# Patient Record
Sex: Female | Born: 1955
Health system: Southern US, Community
[De-identification: ages and names within clinical notes are randomized; demographics above are authoritative.]

## PROBLEM LIST (undated history)

## (undated) DIAGNOSIS — I10 Essential (primary) hypertension: Secondary | ICD-10-CM

## (undated) DIAGNOSIS — C50919 Malignant neoplasm of unspecified site of unspecified female breast: Secondary | ICD-10-CM

## (undated) HISTORY — PX: OTHER SURGICAL HISTORY: SHX169

## (undated) HISTORY — DX: Malignant neoplasm of unspecified site of unspecified female breast: C50.919

## (undated) HISTORY — PX: REDUCTION MAMMAPLASTY: SUR839

## (undated) HISTORY — DX: Essential (primary) hypertension: I10

## (undated) HISTORY — PX: BREAST LUMPECTOMY: SHX2

---

## 1998-06-16 ENCOUNTER — Other Ambulatory Visit: Admission: RE | Admit: 1998-06-16 | Discharge: 1998-06-16 | Payer: Self-pay | Admitting: Obstetrics & Gynecology

## 1999-08-14 ENCOUNTER — Other Ambulatory Visit: Admission: RE | Admit: 1999-08-14 | Discharge: 1999-08-14 | Payer: Self-pay | Admitting: Obstetrics & Gynecology

## 1999-08-15 ENCOUNTER — Other Ambulatory Visit: Admission: RE | Admit: 1999-08-15 | Discharge: 1999-08-15 | Payer: Self-pay | Admitting: Obstetrics & Gynecology

## 1999-08-21 ENCOUNTER — Ambulatory Visit (HOSPITAL_COMMUNITY): Admission: RE | Admit: 1999-08-21 | Discharge: 1999-08-21 | Payer: Self-pay | Admitting: Obstetrics & Gynecology

## 2000-10-13 ENCOUNTER — Other Ambulatory Visit: Admission: RE | Admit: 2000-10-13 | Discharge: 2000-10-13 | Payer: Self-pay | Admitting: Obstetrics & Gynecology

## 2017-03-11 DIAGNOSIS — C50511 Malignant neoplasm of lower-outer quadrant of right female breast: Secondary | ICD-10-CM | POA: Diagnosis not present

## 2017-03-11 DIAGNOSIS — Z170 Estrogen receptor positive status [ER+]: Secondary | ICD-10-CM | POA: Diagnosis not present

## 2017-06-09 DIAGNOSIS — R7309 Other abnormal glucose: Secondary | ICD-10-CM | POA: Diagnosis not present

## 2017-06-09 DIAGNOSIS — E789 Disorder of lipoprotein metabolism, unspecified: Secondary | ICD-10-CM | POA: Diagnosis not present

## 2017-08-07 DIAGNOSIS — I10 Essential (primary) hypertension: Secondary | ICD-10-CM | POA: Diagnosis not present

## 2017-11-18 DIAGNOSIS — I10 Essential (primary) hypertension: Secondary | ICD-10-CM | POA: Diagnosis not present

## 2017-11-18 DIAGNOSIS — E7800 Pure hypercholesterolemia, unspecified: Secondary | ICD-10-CM | POA: Diagnosis not present

## 2017-11-21 ENCOUNTER — Other Ambulatory Visit: Payer: Self-pay | Admitting: Internal Medicine

## 2017-11-21 DIAGNOSIS — Z853 Personal history of malignant neoplasm of breast: Secondary | ICD-10-CM

## 2017-12-03 ENCOUNTER — Telehealth: Payer: Self-pay | Admitting: Hematology

## 2017-12-03 ENCOUNTER — Encounter: Payer: Self-pay | Admitting: Hematology

## 2017-12-03 NOTE — Telephone Encounter (Signed)
Pt has been scheduled to see Dr. Burr Medico on 10/24 at 230pm. Pt aware to arrive 30 minutes early. Letter mailed to the pt.

## 2017-12-16 ENCOUNTER — Other Ambulatory Visit: Payer: Self-pay | Admitting: Internal Medicine

## 2017-12-16 DIAGNOSIS — Z9889 Other specified postprocedural states: Secondary | ICD-10-CM

## 2017-12-18 ENCOUNTER — Other Ambulatory Visit: Payer: Self-pay | Admitting: Internal Medicine

## 2017-12-18 ENCOUNTER — Other Ambulatory Visit: Payer: Self-pay

## 2017-12-18 DIAGNOSIS — Z9889 Other specified postprocedural states: Secondary | ICD-10-CM

## 2017-12-19 ENCOUNTER — Ambulatory Visit
Admission: RE | Admit: 2017-12-19 | Discharge: 2017-12-19 | Disposition: A | Discharge status: Home / Self Care | Payer: 59 | Source: Ambulatory Visit | Attending: Internal Medicine | Admitting: Internal Medicine

## 2017-12-19 DIAGNOSIS — Z853 Personal history of malignant neoplasm of breast: Secondary | ICD-10-CM | POA: Diagnosis not present

## 2017-12-19 DIAGNOSIS — Z9889 Other specified postprocedural states: Secondary | ICD-10-CM

## 2017-12-19 DIAGNOSIS — R922 Inconclusive mammogram: Secondary | ICD-10-CM | POA: Diagnosis not present

## 2017-12-25 ENCOUNTER — Inpatient Hospital Stay: Payer: 59 | Admitting: Hematology

## 2018-02-16 DIAGNOSIS — I10 Essential (primary) hypertension: Secondary | ICD-10-CM | POA: Diagnosis not present

## 2018-02-16 DIAGNOSIS — Z853 Personal history of malignant neoplasm of breast: Secondary | ICD-10-CM | POA: Diagnosis not present

## 2018-02-16 DIAGNOSIS — E7800 Pure hypercholesterolemia, unspecified: Secondary | ICD-10-CM | POA: Diagnosis not present

## 2018-03-11 ENCOUNTER — Encounter: Payer: Self-pay | Admitting: Hematology

## 2018-03-11 ENCOUNTER — Telehealth: Payer: Self-pay | Admitting: Hematology

## 2018-03-11 NOTE — Telephone Encounter (Signed)
Pt cld to reschedule her appt w/Dr. Burr Medico from Oct. New appt has been scheduled for the pt to see Dr. Burr Medico on  3/5 at 230pm.

## 2018-03-30 ENCOUNTER — Ambulatory Visit
Admission: RE | Admit: 2018-03-30 | Discharge: 2018-03-30 | Disposition: A | Discharge status: Home / Self Care | Payer: 59 | Source: Ambulatory Visit | Attending: Internal Medicine | Admitting: Internal Medicine

## 2018-03-30 ENCOUNTER — Other Ambulatory Visit: Payer: Self-pay | Admitting: Internal Medicine

## 2018-03-30 DIAGNOSIS — M79642 Pain in left hand: Secondary | ICD-10-CM

## 2018-03-30 DIAGNOSIS — M1812 Unilateral primary osteoarthritis of first carpometacarpal joint, left hand: Secondary | ICD-10-CM | POA: Diagnosis not present

## 2018-04-10 DIAGNOSIS — M13842 Other specified arthritis, left hand: Secondary | ICD-10-CM | POA: Diagnosis not present

## 2018-05-06 NOTE — Progress Notes (Signed)
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 [facility] Staging form: Breast, AJCC 8th Edition - Pathologic stage from 04/03/2015: Stage IA (pT1b, pN0, cM0, G1, ER+, PR+, HER2-) - Signed by Truitt Merle, MD on 05/07/2018       Ductal carcinoma in situ (DCIS) of left breast   12/2014 Initial Diagnosis    Ductal carcinoma in situ (DCIS) of left breast    04/03/2015 Cancer Staging    Staging form: Breast, AJCC 8th Edition - Pathologic stage from 04/03/2015: Stage Unknown (pTis (DCIS), pNX, cM0, G2, ER+, PR+, HER2: Not Assessed) - Signed by Truitt Merle, MD on 05/07/2018    04/03/2015 Surgery    Left lumpectomy Left breast lumpectomy with DCIS. Seborrheic keratoses. Left breast tissue, reduction with benign breast tissue with proliferative fibrocytic change. Benign skin. Negative for atypia and malignancy.       07/2015 -  Anti-estrogen oral therapy    Currently on 10 mg Tamoxifen.      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  It showed multifocal invasive lobular carcinoma, grade 1, 2 mm in greatest dimension. ER and PR 100% positive.  IInferior surgical margin was positive.    01/11/2015 Pathology Results    Breast, right side, needle core biopsy with calcifications with lobular carcinoma in  situ. Breast, right side, needle core biopsy without calcifications with atypical lobular hyperplasia.      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 Fibroadenoma. Right breast, superior margin, excision with proliferative fibrocystic change and fibroadenoma. Negative for atypica and malignancy. Right axillary sentinenl lymph node with benign lymph node. Right breast tissue, reduction with benign breast tissue, negative for atypia and malignancy.      06/2015 - 07/2015 Radiation Therapy    Radiation therapy to bilateral breast in [facility]    11/17/2017 Imaging    MRI Bilateral breast IMPRESSION:  Biopsy proven invasive lobular carcinoma in the lateral right breast. There is extensive enhancement at the surgical site. Most likely represents granulation tissue from healing, but it is not possible to exclude residual tumor enhancement in this pt who had positive margins on the lumpectomy specimen. Within the right lower inner breast is a superficial location, there is a lobulated enhabcing mass. This has MR charactics suggesting a possible fibroadenoma. Within the lower central left breast, there is linear and beaded enhancement in a ductal distribution.      12/19/2017 Mammogram    Bilateral diagnostic  mammography with tomography IMPRESSION: No evidence of mammographic evidence of malignancy.        HISTORY OF PRESENTING ILLNESS:  Debbie Abbott 63 y.o. female is here to establish her oncology care with [place].  She relocated from [place] to [facility] last year, and she was referred by her primary care physician to [place].   Her  last appointment was January 2019 with her oncologist in [place].   She was diagnosed in 12/2014 with Stage 0, DCIS, left breast, LIQ, Tis, N0, M0, Grade 2, BRCA variant of unknown significance. She was also diagnosed on 01/06/2015 with primary malignant neoplasm, Stage IA (Primary, right breast LOQ, T1a, N0, M0). Lobular invasive carcinoma, grade 1, ER/PR (+), HER2 (neg).   Her breast cancers were discovered by screening mammogram. She underwent bilateral lumpectomy and right SLN biopsy on 04/03/2015 showed: Left breast lumpectomy with DCIS. Seborrheic keratoses. Right breast, reexcision lumpectomy with sentinel node biopsy with invasive lobular carinoma. Fibroadenoma. Right breast, superior margin, excision with proliferative fibrocystic change and fibroadenoma. Negative for atypica and malignancy. Right axillary sentinenl lymph node with benign lymph node. Right breast tissue, reduction with benign breast tissue, negative for atypia and malignancy. Left breast tissue, reduction with benign breast tissue with proliferative fibrocytic change. Benign skin. Negative for atypia and malignancy.   She then proceeded to radiation therapy to primary tumor to her bilateral breast on the following dates, 06/2015-07/2015. She is currently on 10 mg Tamoxifen started on 07/2015 and will continue for 5 years. She was initially on 20 mg Tamoxifen, however, this was decreased to 10 mg due to hot flashes. Her hot flashes have decreased following this. Her LMP was 10+ years ago and she had "bad" menopausal symptoms at that time. She is unsure of why tamoxifen was chosen over other anti-estrogen therapy measures. She was receiving care at [facility] in [place]. She just moved from [place] to [place] and is now living with her husband who is a Secondary school teacher.   MRI Breast on 11/17/2017 showed: Bx proven invasive lobular carcinoma in the lateral right breast. There is extensive enhancement at the surgical site. Most likely represents  granulation tissue from healing, but it is not possible to exclude residual tumor enhancement in this pt who had positive margins on the lumpectomy specimen. Within the right lower inner breast is a superficial location, there is a lobulated enhabcing mass. This has MR charactics suggesting a possible fibroadenoma. Within the lower central left breast, there is linear and beaded enhancement in a ductal distribution.   She underwent bilateral diagnostic mammography with tomography on 12/19/2017 showing: No evidence of mammographic evidence of malignancy.  She has a hx of HTN, controlled with medications. She denies prior hx of breast cancer, heart, lung, anemia issues. She had a lipoma removed. She has had a bone density scan completed several years ago. She is taking vitamin D following her last blood work showing slight vitamin D deficiency.   Family hx: Her sister was diagnosed with breast cancer in 2016.  Social hx: She has twins that are 60 years old (daughter and son). Her daughter is special needs and lives at home with the patient. She is looking into [employer] for her daughter. She consumes 1 glass of wine every couple of weeks and 1 shot of bourbon weekly. She stopped smoking in 1990 and she used to smoke 1.5 PPD for 12-14 years. 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  Her husband works in Geophysicist/field seismologist estate. She lives in [place]. She is 1 year overdue for a colonoscopy. She is due for her pap smear as well.   Her PCP, Dr. Emi Belfast is retiring.   On review of systems, she reports tenderness/numbness to her incision site. she denies any other symptoms. Pertinent positives are listed and detailed within the above HPI.   MEDICAL HISTORY:  Past Medical History:  Diagnosis Date  . Breast cancer ([place])   . Hypertension     SURGICAL HISTORY: Past Surgical History:  Procedure Laterality Date  . BREAST LUMPECTOMY Right   . BREAST  LUMPECTOMY Left     SOCIAL HISTORY: Social History   Socioeconomic History  . Marital status: Married    Spouse name: Not on file  . Number of children: 2  . Years of education: Not on file  . Highest education level: Not on file  Occupational History  . Occupation: Air traffic controller Needs  . Financial resource strain: Not on file  . Food insecurity:    Worry: Not on file    Inability: Not on file  . Transportation needs:    Medical: Not on file    Non-medical: Not on file  Tobacco Use  . Smoking status: Former Smoker    Packs/day: 1.50    Years: 14.00    Pack years: 21.00    Last attempt to quit: 03/04/1988    Years since quitting: 30.1  Substance and Sexual Activity  . Alcohol use: Yes    Alcohol/week: 2.0 standard drinks    Types: 1 Shots of liquor, 1 Glasses of wine per week  . Drug use: Not on file  . Sexual activity: Not on file  Lifestyle  . Physical activity:    Days per week: Not on file    Minutes per session: Not on file  . Stress: Not on file  Relationships  . Social connections:    Talks on phone: Not on file    Gets together: Not on file    Attends religious service: Not on file    Active member of club or organization: Not on file    Attends meetings of clubs or organizations: Not on file    Relationship status: Not on file  . Intimate partner violence:    Fear of current or ex partner: Not on file    Emotionally abused: Not on file    Physically abused: Not on file    Forced sexual activity: Not on file  Other Topics Concern  . Not on file  Social History Narrative  . Not on file    FAMILY HISTORY: Family History  Problem Relation Age of Onset  . Breast cancer Sister 21  . Breast cancer Paternal Aunt     ALLERGIES:  has no allergies on file.  MEDICATIONS:  Current Outpatient Medications  Medication Sig Dispense Refill  . atorvastatin (LIPITOR) 10 MG tablet Take 10 mg by mouth daily.    Marland Kitchen olmesartan-hydrochlorothiazide (BENICAR HCT)  20-12.5 MG tablet Take 1 tablet by mouth daily.    . tamoxifen (NOLVADEX) 10 MG tablet Take 10 mg by mouth daily. Prescribed 20 mg only taking one half    . anastrozole (ARIMIDEX) 1 MG tablet Take 1 tablet (1 mg total) by mouth daily. 30 tablet 11   No current facility-administered medications for this visit.     REVIEW OF SYSTEMS:   Constitutional: Denies fevers, chills or abnormal night sweats Eyes: Denies  blurriness of vision, double vision or watery eyes Ears, nose, mouth, throat, and face: Denies mucositis or sore throat Respiratory: Denies cough, dyspnea or wheezes Cardiovascular: Denies palpitation, chest discomfort or lower extremity swelling Gastrointestinal:  Denies nausea, heartburn or change in bowel habits Skin: Denies abnormal skin rashes Lymphatics: Denies new lymphadenopathy or easy bruising Neurological:Denies numbness, tingling or new weaknesses Behavioral/Psych: Mood is stable, no new changes  All other systems were reviewed with the patient and are negative.  PHYSICAL EXAMINATION:  ECOG PERFORMANCE STATUS: 1 - Symptomatic but completely ambulatory  Vitals:   05/07/18 1425  BP: (!) 125/91  Pulse: 68  Resp: 18  Temp: 97.7 F (36.5 C)  SpO2: 100%   Filed Weights   05/07/18 1425  Weight: 198 lb 12.8 oz (90.2 kg)    GENERAL:alert, no distress and comfortable SKIN: skin color, texture, turgor are normal, no rashes or significant lesions EYES: normal, conjunctiva are pink and non-injected, sclera clear OROPHARYNX:no exudate, no erythema and lips, buccal mucosa, and tongue normal  NECK: supple, thyroid normal size, non-tender, without nodularity LYMPH:  no palpable lymphadenopathy in the cervical, axillary or inguinal LUNGS: clear to auscultation and percussion with normal breathing effort HEART: regular rate & rhythm and no murmurs and no lower extremity edema ABDOMEN:abdomen soft, non-tender and normal bowel sounds Musculoskeletal:no cyanosis of digits and  no clubbing  PSYCH: alert & oriented x 3 with fluent speech NEURO: no focal motor/sensory deficits Breast: Negative s/p bilateral lumpectomy and reduction. Surgical scars well healed. No palpable scar or mass in breasts and bilateral axilla.   LABORATORY DATA:  I have reviewed the data as listed CBC Latest Ref Rng & Units 05/07/2018  WBC 4.0 - 10.5 K/uL 5.4  Hemoglobin 12.0 - 15.0 g/dL 12.4  Hematocrit 36.0 - 46.0 % 36.5  Platelets 150 - 400 K/uL 179   CMP Latest Ref Rng & Units 05/07/2018  Glucose 70 - 99 mg/dL 95  BUN 8 - 23 mg/dL 20  Creatinine 0.44 - 1.00 mg/dL 1.21(H)  Sodium 135 - 145 mmol/L 141  Potassium 3.5 - 5.1 mmol/L 3.8  Chloride 98 - 111 mmol/L 106  CO2 22 - 32 mmol/L 26  Calcium 8.9 - 10.3 mg/dL 9.1  Total Protein 6.5 - 8.1 g/dL 7.1  Total Bilirubin 0.3 - 1.2 mg/dL 0.5  Alkaline Phos 38 - 126 U/L 64  AST 15 - 41 U/L 15  ALT 0 - 44 U/L 13    RADIOGRAPHIC STUDIES: I have personally reviewed the radiological images as listed and agreed with the findings in the report. No results found.  ASSESSMENT & PLAN:  63 y.o. female with a history of bilateral breast cancer   1.  Primary cancer of right breast LOQ, pT1a, N0, M0, stage IA, Lobular invasive carcinoma, grade 1, ER/PR (+), HER2 (neg).  -I have reviewed her outside medical records, and confirmed key findings with patient -Left lumpectomy on 04/03/2015 showed: Left breast lumpectomy with DCIS and right lumpectomy with sentinel node biopsy showed a 21m invasive lobular carinoma, surgical margines were negative, node negative.  -she underwent adjuvant RT to her bilateral breast on the following dates, 06/2015-07/2015.  -MRI Breast on 11/17/2017 showed: Bx proven invasive lobular carcinoma in the lateral right breast. There is extensive enhancement at the surgical site. Most likely represents granulation tissue from healing, but it is not possible to exclude residual tumor enhancement in this pt who had positive margins on the  lumpectomy specimen. Within the right lower inner breast is  a superficial location, there is a lobulated enhabcing mass. This has MR charactics suggesting a possible fibroadenoma. Within the lower central left breast, there is linear and beaded enhancement in a ductal distribution.  -Bilateral diagnostic mammography with tomography on 12/19/2017 showing: No evidence of mammographic evidence of malignancy.   -Currently on 10 mg Tamoxifen since May 2017 (reduced from 20 mg due to hot flashes).  -Pt is postmenopausal, I discussed the different side effects from tamoxifen and aromatase inhibitor, since she did not tolerating full dose tamoxifen, I recommend that the pt switch to anastrozole after bone density scan, if it shows that the patient doesn't have sever osteoporosis -Provided the patient with a handout on anastrozole for her to review.  -I do recommend adjuvant aromatase inhibitor to reduce her risk of cancer recurrence. The potential benefit and side effects, which includes but not limited to, hot flash, skin and vaginal dryness, metabolic changes (increased blood glucose, cholesterol, weight, etc.), slightly in increased risk of cardiovascular disease, cataracts, muscular and joint discomfort, osteopenia and osteoporosis, etc, were discussed with her in great details. She is interested, and agrees to proceed  -Will have the patient to complete MRI breast in April 2020 with a repeat mammogram in October 2020. Asked the patient to check with her insurance to determine if it is covered. Discussed "Fast MRI" with patient and noted that insurance doesn't cover it, however, it is $400 out of pocket. Patient will follow up with her insurance and then call back and then the MRI will be ordered at that time.  -Patient will be prescribed anastrozole today, however, discussed with the patient that if she has severe osteoporosis, then she will continue tamoxifen.  -DEXA at [facility] in 2-3 weeks -Lab and f/u in 6  months  -Lab today   2. Stage 0, DCIS, left breast, LIQ, Tis, N0, M0, Grade 2 -Cured by surgery.  -At risk for recurrence, continue surveillance.   3. BRCA variant. (diagnosed October 2016) -per her previous oncology note, she has BRCA variant, not sure if true mutation or variant of unknown significance  -I will get a copy of her genetic testing   4. Hypertension -continue olmesartan-HCTZ and f/u with PCP  Plan:  -DEXA at [facility] in 2-3 weeks -Lab and f/u in 6 months  -Lab today   Orders Placed This Encounter  Procedures  . DG Bone Density    Standing Status:   Future    Standing Expiration Date:   05/07/2019    Order Specific Question:   Reason for Exam (SYMPTOM  OR DIAGNOSIS REQUIRED)    Answer:   screening    Order Specific Question:   Preferred imaging location?    Answer:   [facility]  . CBC with Differential (Cancer Center Only)    Standing Status:   Standing    Number of Occurrences:   50    Standing Expiration Date:   05/07/2023  . CMP ([place] only)    Standing Status:   Standing    Number of Occurrences:   50    Standing Expiration Date:   05/07/2023    All questions were answered. The patient knows to call the clinic with any problems, questions or concerns. I spent 30 minutes counseling the patient face to face. The total time spent in the appointment was 50 minutes and more than 50% was on counseling.     Truitt Merle, MD 05/07/2018   I, Soijett Blue am acting as scribe for Dr.  Truitt Merle.  I have reviewed the above documentation for accuracy and completeness, and I agree with the above.

## 2018-05-07 ENCOUNTER — Telehealth: Payer: Self-pay | Admitting: Hematology

## 2018-05-07 ENCOUNTER — Inpatient Hospital Stay: Payer: 59 | Attending: Hematology | Admitting: Hematology

## 2018-05-07 ENCOUNTER — Inpatient Hospital Stay: Payer: 59

## 2018-05-07 ENCOUNTER — Encounter: Payer: Self-pay | Admitting: Hematology

## 2018-05-07 VITALS — BP 125/91 | HR 68 | Temp 97.7°F | Resp 18 | Ht 68.5 in | Wt 198.8 lb

## 2018-05-07 DIAGNOSIS — I10 Essential (primary) hypertension: Secondary | ICD-10-CM | POA: Insufficient documentation

## 2018-05-07 DIAGNOSIS — Z79810 Long term (current) use of selective estrogen receptor modulators (SERMs): Secondary | ICD-10-CM | POA: Insufficient documentation

## 2018-05-07 DIAGNOSIS — C50511 Malignant neoplasm of lower-outer quadrant of right female breast: Secondary | ICD-10-CM | POA: Insufficient documentation

## 2018-05-07 DIAGNOSIS — Z170 Estrogen receptor positive status [ER+]: Secondary | ICD-10-CM | POA: Insufficient documentation

## 2018-05-07 DIAGNOSIS — D0512 Intraductal carcinoma in situ of left breast: Secondary | ICD-10-CM | POA: Insufficient documentation

## 2018-05-07 DIAGNOSIS — E559 Vitamin D deficiency, unspecified: Secondary | ICD-10-CM | POA: Diagnosis not present

## 2018-05-07 DIAGNOSIS — E2839 Other primary ovarian failure: Secondary | ICD-10-CM

## 2018-05-07 LAB — CBC WITH DIFFERENTIAL (CANCER CENTER ONLY)
Abs Immature Granulocytes: 0.02 10*3/uL (ref 0.00–0.07)
Basophils Absolute: 0 10*3/uL (ref 0.0–0.1)
Basophils Relative: 1 %
EOS ABS: 0.1 10*3/uL (ref 0.0–0.5)
Eosinophils Relative: 2 %
HCT: 36.5 % (ref 36.0–46.0)
Hemoglobin: 12.4 g/dL (ref 12.0–15.0)
Immature Granulocytes: 0 %
Lymphocytes Relative: 26 %
Lymphs Abs: 1.4 10*3/uL (ref 0.7–4.0)
MCH: 30.8 pg (ref 26.0–34.0)
MCHC: 34 g/dL (ref 30.0–36.0)
MCV: 90.6 fL (ref 80.0–100.0)
Monocytes Absolute: 0.4 10*3/uL (ref 0.1–1.0)
Monocytes Relative: 8 %
Neutro Abs: 3.5 10*3/uL (ref 1.7–7.7)
Neutrophils Relative %: 63 %
Platelet Count: 179 10*3/uL (ref 150–400)
RBC: 4.03 MIL/uL (ref 3.87–5.11)
RDW: 13.2 % (ref 11.5–15.5)
WBC Count: 5.4 10*3/uL (ref 4.0–10.5)
nRBC: 0 % (ref 0.0–0.2)

## 2018-05-07 LAB — CMP (CANCER CENTER ONLY)
ALK PHOS: 64 U/L (ref 38–126)
ALT: 13 U/L (ref 0–44)
AST: 15 U/L (ref 15–41)
Albumin: 4 g/dL (ref 3.5–5.0)
Anion gap: 9 (ref 5–15)
BUN: 20 mg/dL (ref 8–23)
CO2: 26 mmol/L (ref 22–32)
Calcium: 9.1 mg/dL (ref 8.9–10.3)
Chloride: 106 mmol/L (ref 98–111)
Creatinine: 1.21 mg/dL — ABNORMAL HIGH (ref 0.44–1.00)
GFR, Est AFR Am: 55 mL/min — ABNORMAL LOW (ref >60)
GFR, Estimated: 48 mL/min — ABNORMAL LOW (ref >60)
Glucose, Bld: 95 mg/dL (ref 70–99)
Potassium: 3.8 mmol/L (ref 3.5–5.1)
SODIUM: 141 mmol/L (ref 135–145)
Total Bilirubin: 0.5 mg/dL (ref 0.3–1.2)
Total Protein: 7.1 g/dL (ref 6.5–8.1)

## 2018-05-07 MED ORDER — ANASTROZOLE 1 MG PO TABS: 1.0000 mg | ORAL_TABLET | Freq: Every day | ORAL | 11 refills | Status: DC

## 2018-05-07 NOTE — Telephone Encounter (Signed)
Gave avs and calendar ° °

## 2018-05-11 ENCOUNTER — Telehealth: Payer: Self-pay

## 2018-05-11 NOTE — Telephone Encounter (Signed)
Spoke with patient regarding lab results, per Dr. Burr Medico creatinine is slightly elevated, informed her this is an indicator of her not drinking enough fluids.  Instructed her to drink at least 6 to 8 glasses of water daily, she verbalized an understanding.

## 2018-05-11 NOTE — Telephone Encounter (Signed)
-----   Message from Truitt Merle, MD sent at 05/09/2018 10:33 AM EST ----- Please let pt know her lab results, Cr slightly elevated, make sure she drinks fluids adequately, continue monitoring.   Truitt Merle  05/09/2018

## 2018-05-19 ENCOUNTER — Other Ambulatory Visit: Payer: 59

## 2018-07-21 ENCOUNTER — Other Ambulatory Visit: Payer: 59

## 2018-09-10 ENCOUNTER — Other Ambulatory Visit: Payer: 59

## 2018-11-05 ENCOUNTER — Telehealth: Payer: Self-pay

## 2018-11-05 ENCOUNTER — Telehealth: Payer: Self-pay | Admitting: Hematology

## 2018-11-05 NOTE — Telephone Encounter (Signed)
Debbie Abbott, is this facility fee? Hope you can explain it to pt.   Malachy Mood, does pt want [place] to refer her to somewhere else? Thanks   Truitt Merle MD

## 2018-11-05 NOTE — Telephone Encounter (Signed)
Per pt to fax records to [facility] in [place], [place]

## 2018-11-05 NOTE — Telephone Encounter (Signed)
Patient calls stating that she will not be coming back to the Cancer center, going elsewhere due to charges that she thinks are "ridiculous".  She states there was a room charge of over $300 and was reduced down after she complained.  She wants her appointment on 9/9 cancelled.

## 2018-11-11 ENCOUNTER — Other Ambulatory Visit: Payer: 59

## 2018-11-11 ENCOUNTER — Ambulatory Visit: Payer: 59 | Admitting: Hematology

## 2019-04-12 ENCOUNTER — Telehealth: Payer: Self-pay

## 2019-04-12 NOTE — Telephone Encounter (Signed)
Pt called left vm requesting records be faxed to [facility] [place]  [address] Debbie Abbott  [phone]. She stated this was her 3rd request.  I sent a high priority message to [place] [facility].

## 2019-04-14 ENCOUNTER — Telehealth: Payer: Self-pay | Admitting: Hematology

## 2019-04-14 NOTE — Telephone Encounter (Signed)
Faxed records to [phone]  [facility]

## 2019-05-01 ENCOUNTER — Other Ambulatory Visit: Payer: Self-pay | Admitting: Hematology

## 2019-09-05 IMAGING — MG DIGITAL DIAGNOSTIC BILATERAL MAMMOGRAM WITH TOMO AND CAD
9 series · 9 of 25 positions shown · non-contrast
Comparison: Previous exam(s).

CLINICAL DATA: Bilateral lumpectomies for breast cancer with
bilateral breast reductions as well.

EXAM:
DIGITAL DIAGNOSTIC BILATERAL MAMMOGRAM WITH CAD AND TOMO

[R MLO]
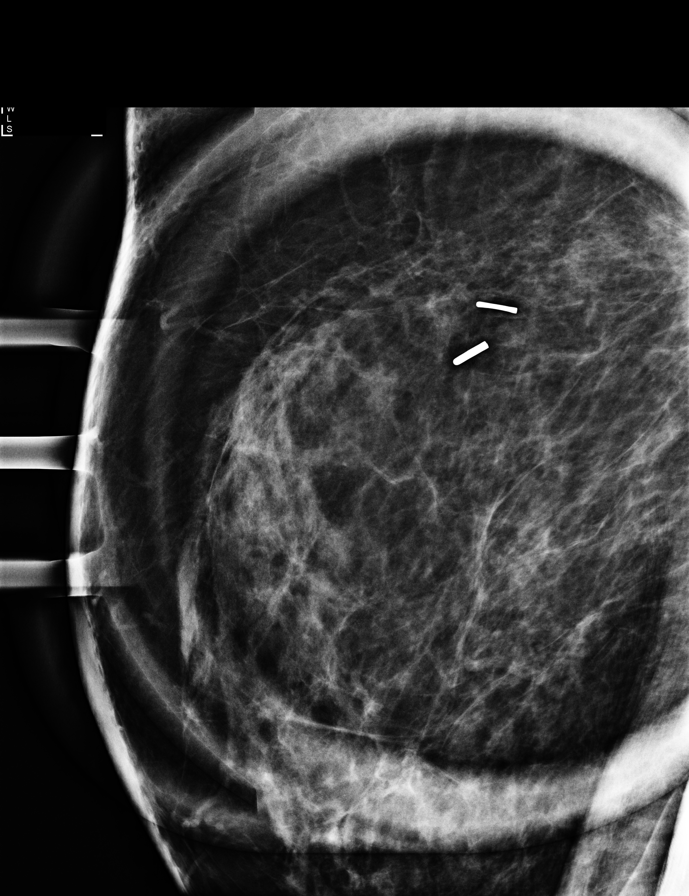

[L MLO synth-2D]
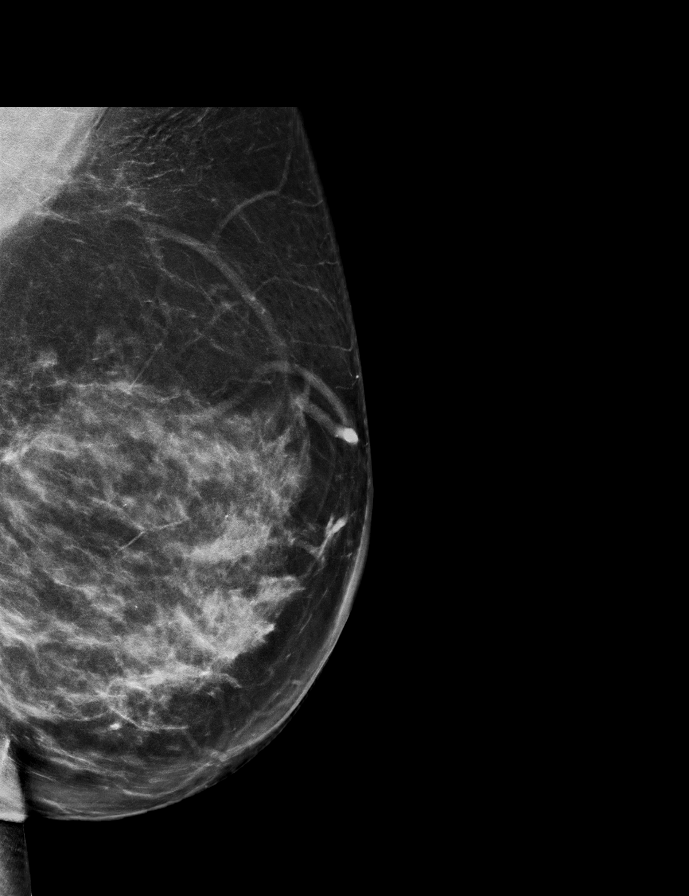

[L CC synth-2D]
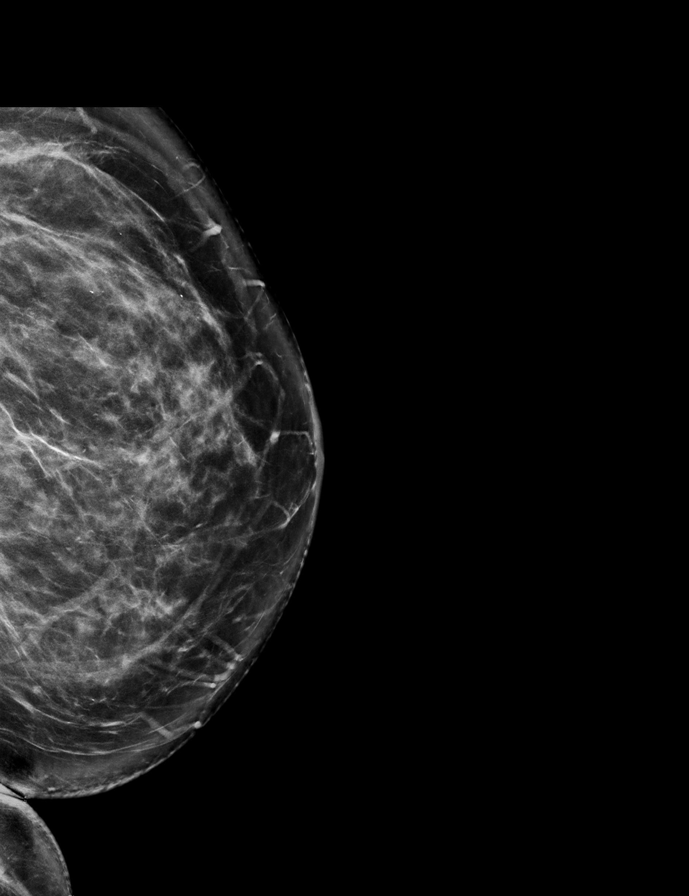

[R MLO synth-2D]
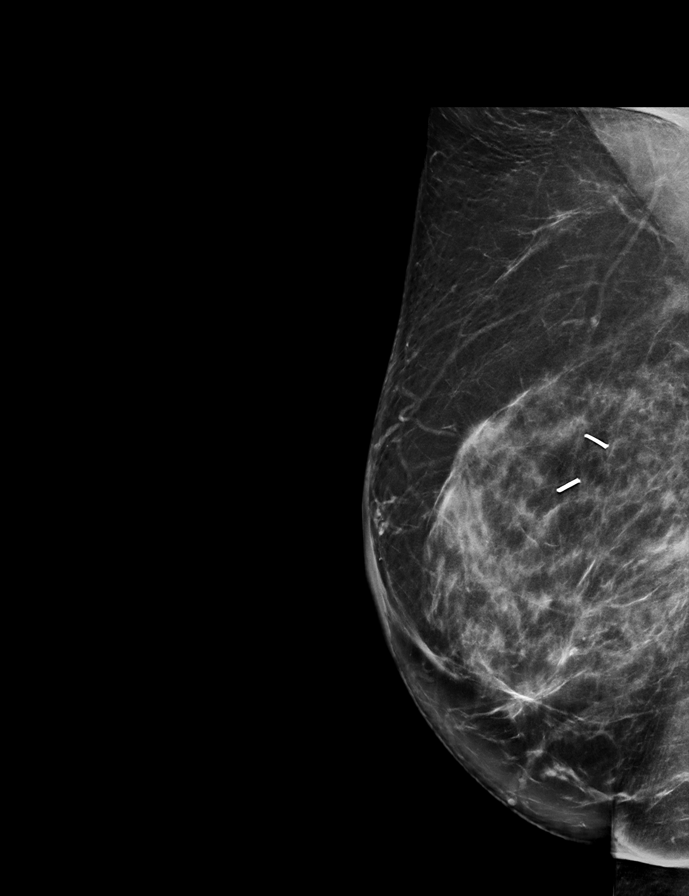

[R CC synth-2D]
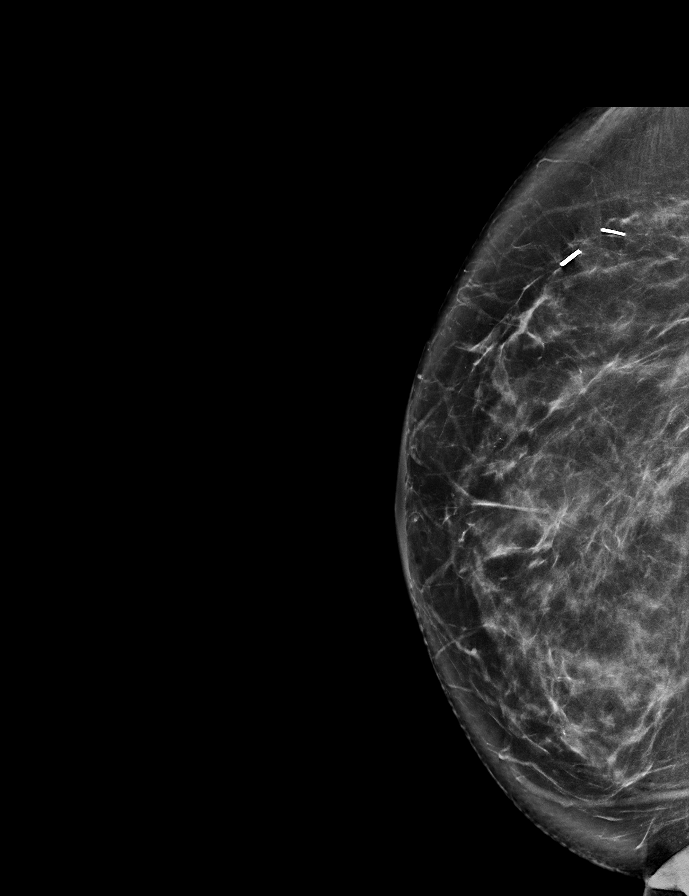

[R CC tomo · tomo slice 39/77.0]
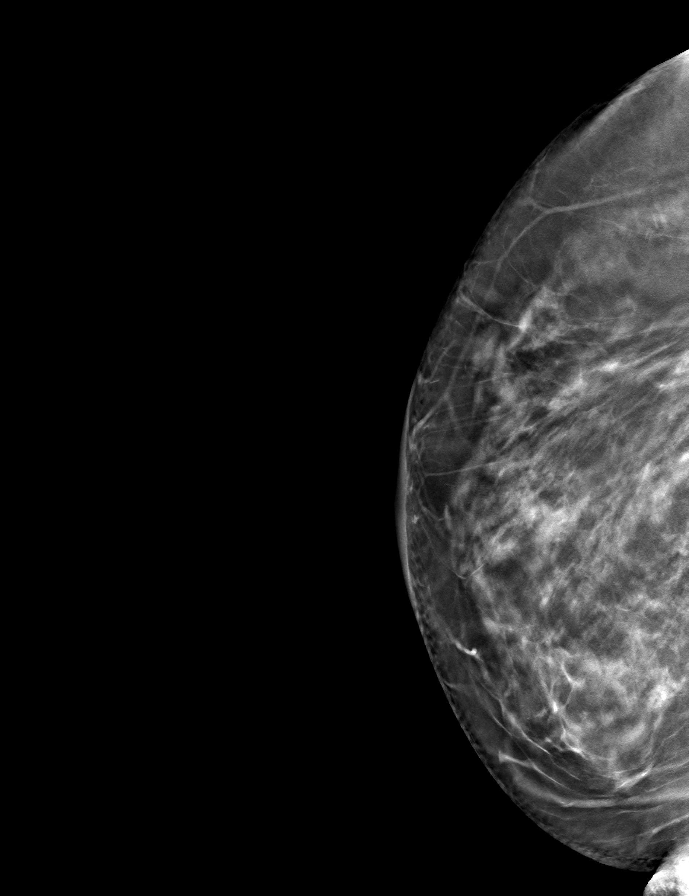

[L CC tomo · tomo slice 41/81.0]
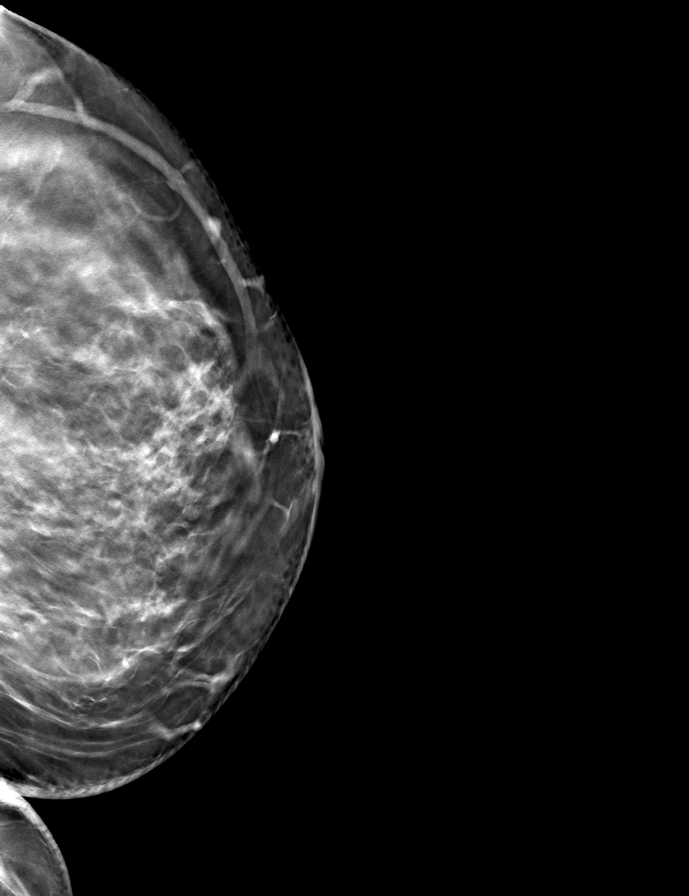

[R MLO tomo · tomo slice 40/79.0]
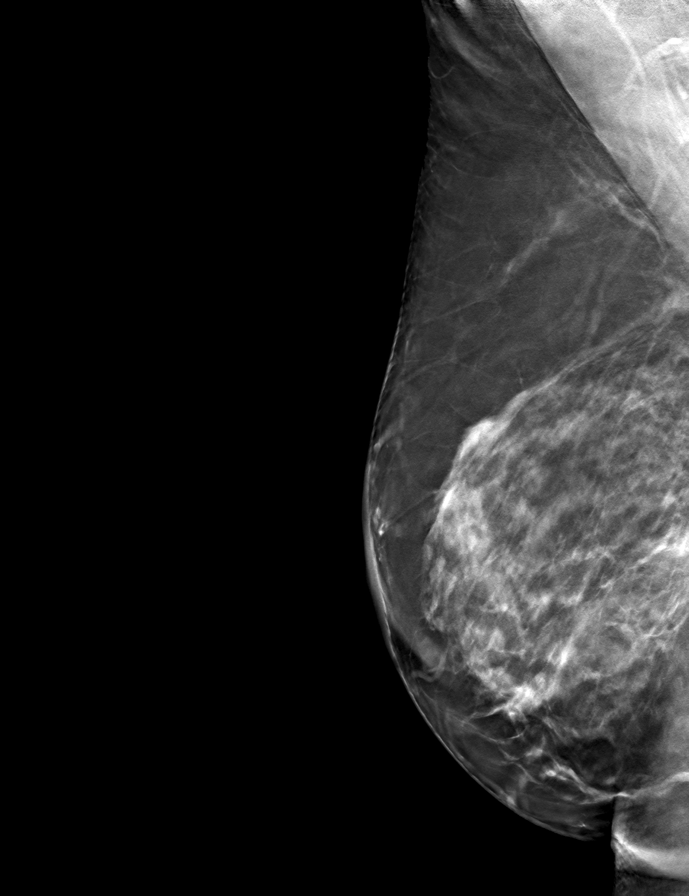

[L MLO tomo · tomo slice 43/84.0]
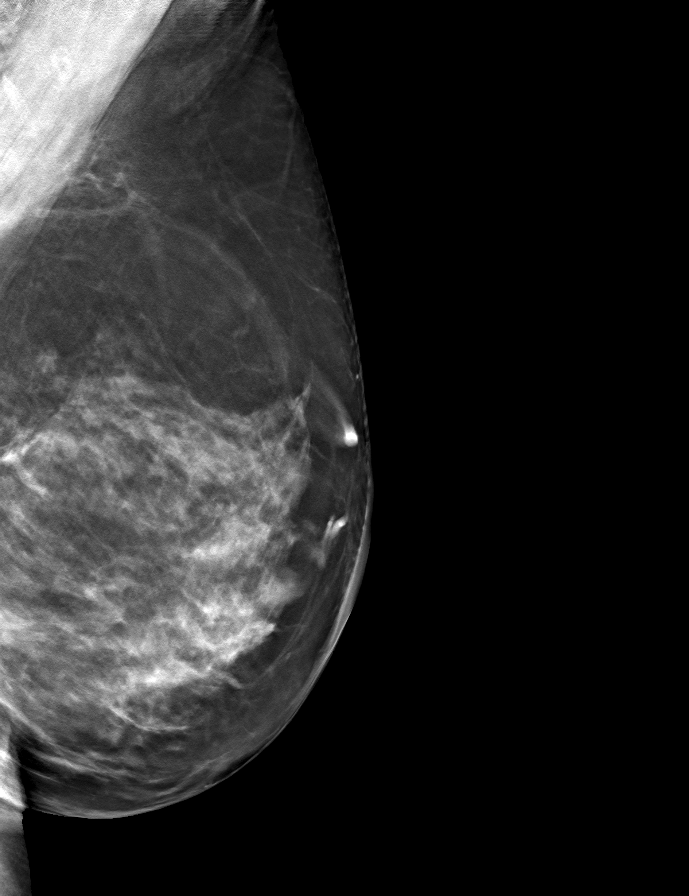

[9 of 25 positions shown; findings below may reference images not displayed]

ACR Breast Density Category c: The breast tissue is heterogeneously
dense, which may obscure small masses.
FINDINGS: No suspicious masses, calcifications, or distortion in either
breast.

Mammographic images were processed with CAD.
IMPRESSION: No mammographic evidence of malignancy.

RECOMMENDATION:
Annual diagnostic mammography.

I have discussed the findings and recommendations with the patient.
Results were also provided in writing at the conclusion of the
visit. If applicable, a reminder letter will be sent to the patient
regarding the next appointment.

BI-RADS CATEGORY  2: Benign.

## 2020-03-06 DIAGNOSIS — M150 Primary generalized (osteo)arthritis: Secondary | ICD-10-CM | POA: Diagnosis not present

## 2020-03-06 DIAGNOSIS — M0609 Rheumatoid arthritis without rheumatoid factor, multiple sites: Secondary | ICD-10-CM | POA: Diagnosis not present

## 2020-03-06 DIAGNOSIS — E669 Obesity, unspecified: Secondary | ICD-10-CM | POA: Diagnosis not present

## 2020-03-06 DIAGNOSIS — M2550 Pain in unspecified joint: Secondary | ICD-10-CM | POA: Diagnosis not present

## 2020-03-06 DIAGNOSIS — Z6831 Body mass index (BMI) 31.0-31.9, adult: Secondary | ICD-10-CM | POA: Diagnosis not present

## 2020-03-08 DIAGNOSIS — I10 Essential (primary) hypertension: Secondary | ICD-10-CM | POA: Diagnosis not present

## 2020-03-08 DIAGNOSIS — N1831 Chronic kidney disease, stage 3a: Secondary | ICD-10-CM | POA: Diagnosis not present

## 2020-03-08 DIAGNOSIS — E782 Mixed hyperlipidemia: Secondary | ICD-10-CM | POA: Diagnosis not present

## 2020-03-08 DIAGNOSIS — Z23 Encounter for immunization: Secondary | ICD-10-CM | POA: Diagnosis not present

## 2020-03-08 DIAGNOSIS — Z853 Personal history of malignant neoplasm of breast: Secondary | ICD-10-CM | POA: Diagnosis not present

## 2020-03-08 DIAGNOSIS — M0600 Rheumatoid arthritis without rheumatoid factor, unspecified site: Secondary | ICD-10-CM | POA: Diagnosis not present

## 2020-03-08 DIAGNOSIS — E559 Vitamin D deficiency, unspecified: Secondary | ICD-10-CM | POA: Diagnosis not present

## 2020-03-08 DIAGNOSIS — Z86010 Personal history of colonic polyps: Secondary | ICD-10-CM | POA: Diagnosis not present

## 2020-05-08 DIAGNOSIS — M2550 Pain in unspecified joint: Secondary | ICD-10-CM | POA: Diagnosis not present

## 2020-05-08 DIAGNOSIS — D0512 Intraductal carcinoma in situ of left breast: Secondary | ICD-10-CM | POA: Diagnosis not present

## 2020-05-08 DIAGNOSIS — Z1231 Encounter for screening mammogram for malignant neoplasm of breast: Secondary | ICD-10-CM | POA: Diagnosis not present

## 2020-06-05 DIAGNOSIS — Z6831 Body mass index (BMI) 31.0-31.9, adult: Secondary | ICD-10-CM | POA: Diagnosis not present

## 2020-06-05 DIAGNOSIS — M150 Primary generalized (osteo)arthritis: Secondary | ICD-10-CM | POA: Diagnosis not present

## 2020-06-05 DIAGNOSIS — M0609 Rheumatoid arthritis without rheumatoid factor, multiple sites: Secondary | ICD-10-CM | POA: Diagnosis not present

## 2020-06-05 DIAGNOSIS — E669 Obesity, unspecified: Secondary | ICD-10-CM | POA: Diagnosis not present

## 2020-06-05 DIAGNOSIS — M2550 Pain in unspecified joint: Secondary | ICD-10-CM | POA: Diagnosis not present

## 2020-06-05 DIAGNOSIS — Z79899 Other long term (current) drug therapy: Secondary | ICD-10-CM | POA: Diagnosis not present

## 2020-09-11 DIAGNOSIS — Z79899 Other long term (current) drug therapy: Secondary | ICD-10-CM | POA: Diagnosis not present

## 2020-09-11 DIAGNOSIS — M2550 Pain in unspecified joint: Secondary | ICD-10-CM | POA: Diagnosis not present

## 2020-09-11 DIAGNOSIS — M150 Primary generalized (osteo)arthritis: Secondary | ICD-10-CM | POA: Diagnosis not present

## 2020-09-11 DIAGNOSIS — M0609 Rheumatoid arthritis without rheumatoid factor, multiple sites: Secondary | ICD-10-CM | POA: Diagnosis not present

## 2020-09-11 DIAGNOSIS — E669 Obesity, unspecified: Secondary | ICD-10-CM | POA: Diagnosis not present

## 2020-09-11 DIAGNOSIS — Z6830 Body mass index (BMI) 30.0-30.9, adult: Secondary | ICD-10-CM | POA: Diagnosis not present

## 2020-09-22 DIAGNOSIS — M0609 Rheumatoid arthritis without rheumatoid factor, multiple sites: Secondary | ICD-10-CM | POA: Diagnosis not present

## 2020-10-20 DIAGNOSIS — M0609 Rheumatoid arthritis without rheumatoid factor, multiple sites: Secondary | ICD-10-CM | POA: Diagnosis not present

## 2020-12-18 DIAGNOSIS — M064 Inflammatory polyarthropathy: Secondary | ICD-10-CM | POA: Diagnosis not present

## 2020-12-18 DIAGNOSIS — M0609 Rheumatoid arthritis without rheumatoid factor, multiple sites: Secondary | ICD-10-CM | POA: Diagnosis not present

## 2020-12-18 DIAGNOSIS — Z111 Encounter for screening for respiratory tuberculosis: Secondary | ICD-10-CM | POA: Diagnosis not present

## 2020-12-18 DIAGNOSIS — R5383 Other fatigue: Secondary | ICD-10-CM | POA: Diagnosis not present

## 2020-12-18 DIAGNOSIS — Z79899 Other long term (current) drug therapy: Secondary | ICD-10-CM | POA: Diagnosis not present

## 2020-12-26 DIAGNOSIS — M150 Primary generalized (osteo)arthritis: Secondary | ICD-10-CM | POA: Diagnosis not present

## 2020-12-26 DIAGNOSIS — M0609 Rheumatoid arthritis without rheumatoid factor, multiple sites: Secondary | ICD-10-CM | POA: Diagnosis not present

## 2020-12-26 DIAGNOSIS — E669 Obesity, unspecified: Secondary | ICD-10-CM | POA: Diagnosis not present

## 2020-12-26 DIAGNOSIS — Z79899 Other long term (current) drug therapy: Secondary | ICD-10-CM | POA: Diagnosis not present

## 2020-12-26 DIAGNOSIS — Z6830 Body mass index (BMI) 30.0-30.9, adult: Secondary | ICD-10-CM | POA: Diagnosis not present

## 2020-12-26 DIAGNOSIS — M2550 Pain in unspecified joint: Secondary | ICD-10-CM | POA: Diagnosis not present

## 2021-01-08 DIAGNOSIS — M25572 Pain in left ankle and joints of left foot: Secondary | ICD-10-CM | POA: Diagnosis not present

## 2021-01-08 DIAGNOSIS — M25571 Pain in right ankle and joints of right foot: Secondary | ICD-10-CM | POA: Diagnosis not present

## 2021-01-08 DIAGNOSIS — G5762 Lesion of plantar nerve, left lower limb: Secondary | ICD-10-CM | POA: Diagnosis not present

## 2021-01-10 ENCOUNTER — Ambulatory Visit: Payer: Medicare Other

## 2021-01-10 ENCOUNTER — Ambulatory Visit (INDEPENDENT_AMBULATORY_CARE_PROVIDER_SITE_OTHER): Payer: Medicare Other | Admitting: Podiatry

## 2021-01-10 ENCOUNTER — Encounter: Payer: Self-pay | Admitting: Podiatry

## 2021-01-10 ENCOUNTER — Ambulatory Visit (INDEPENDENT_AMBULATORY_CARE_PROVIDER_SITE_OTHER): Payer: Medicare Other

## 2021-01-10 ENCOUNTER — Other Ambulatory Visit: Payer: Self-pay

## 2021-01-10 DIAGNOSIS — M2042 Other hammer toe(s) (acquired), left foot: Secondary | ICD-10-CM | POA: Diagnosis not present

## 2021-01-10 DIAGNOSIS — M79671 Pain in right foot: Secondary | ICD-10-CM

## 2021-01-10 DIAGNOSIS — M79672 Pain in left foot: Secondary | ICD-10-CM

## 2021-01-10 DIAGNOSIS — M2041 Other hammer toe(s) (acquired), right foot: Secondary | ICD-10-CM

## 2021-01-10 DIAGNOSIS — M7751 Other enthesopathy of right foot: Secondary | ICD-10-CM | POA: Diagnosis not present

## 2021-01-10 MED ORDER — TRIAMCINOLONE ACETONIDE 10 MG/ML IJ SUSP
10.0000 mg | Freq: Once | INTRAMUSCULAR | Status: AC
Start: 2021-01-10 — End: 2021-01-10
  Administered 2021-01-10: 10 mg

## 2021-01-10 MED ORDER — DICLOFENAC SODIUM 75 MG PO TBEC: 75.0000 mg | DELAYED_RELEASE_TABLET | Freq: Two times a day (BID) | ORAL | 2 refills | Status: AC

## 2021-01-10 NOTE — Progress Notes (Signed)
Subjective:   Patient ID: Debbie Abbott, female   DOB: 65 y.o.   MRN: [id number]   HPI Patient states she is developed forefoot pain right over left with the right being bad for around 6 months and the left for 2 months and states that the second toe right has moved significantly against the big toe and is also elevated.  Patient's left foot shows moderate inflammation but she has not seen a change in digital position.  Patient does not smoke likes to be active   Review of Systems  All other systems reviewed and are negative.      Objective:  Physical Exam Vitals and nursing note reviewed.  Constitutional:      Appearance: She is well-developed.  Pulmonary:     Effort: Pulmonary effort is normal.  Musculoskeletal:        General: Normal range of motion.  Skin:    General: Skin is warm.  Neurological:     Mental Status: She is alert.    Neurovascular status intact muscle strength adequate range of motion adequate with patient found to have quite a bit of movement of the second toe right and a dorsal medial direction with inflammation fluid of the second MPJ right over left with patient also complaining of periodic burning and shooting type pain.  Patient has good digital perfusion well oriented x3 and flat     Assessment:  Inflammatory capsulitis of the second MPJ right with probability for flexor plate dislocation with probable compensatory pain left or other pathology     Plan:  H&P reviewed condition and I went ahead begin a focus on the right and I did discuss possibility for digital stabilization shortening osteotomy at 1 point in future but at this time organ to try to treat from an acute perspective.  I did do a forefoot block 60 mg Xylocaine Marcaine mixture I then aspirated the second MPJ and was able to get some clear fluid out and injected with quarter cc of dexamethasone Kenalog and applied thick plantar padding bilateral placed on diclofenac 75 mg twice daily and  reappoint 2 weeks  X-rays indicate significant dorsal medial dislocation second digit right foot with no indication stress fracture or arthritis

## 2021-01-22 ENCOUNTER — Other Ambulatory Visit: Payer: Self-pay

## 2021-01-22 ENCOUNTER — Encounter: Payer: Self-pay | Admitting: Podiatry

## 2021-01-22 ENCOUNTER — Ambulatory Visit (INDEPENDENT_AMBULATORY_CARE_PROVIDER_SITE_OTHER): Payer: Medicare Other | Admitting: Podiatry

## 2021-01-22 ENCOUNTER — Telehealth: Payer: Self-pay | Admitting: Podiatry

## 2021-01-22 ENCOUNTER — Ambulatory Visit: Payer: Medicare Other

## 2021-01-22 DIAGNOSIS — M7752 Other enthesopathy of left foot: Secondary | ICD-10-CM

## 2021-01-22 DIAGNOSIS — M7751 Other enthesopathy of right foot: Secondary | ICD-10-CM

## 2021-01-22 NOTE — Progress Notes (Signed)
SITUATION Reason for Consult: Evaluation for Bilateral Custom Foot Orthoses Patient / Caregiver Report: Patient is ready for her foot to stop hurting  OBJECTIVE DATA: Patient History / Diagnosis: Capsulitis Current or Previous Devices: None and no history  Foot Examination: Skin presentation:   Intact Ulcers & Callousing:   None and no history Toe / Foot Deformities:  Hammertoes Weight Bearing Presentation:  Rectus Sensation:    Intact  ORTHOTIC RECOMMENDATION Recommended Device: 1x pair of custom lightweight custom FOs  GOALS OF ORTHOSES - Reduce Pain - Prevent Foot Deformity - Prevent Progression of Further Foot Deformity - Relieve Pressure - Improve the Overall Biomechanical Function of the Foot and Lower Extremity.  ACTIONS PERFORMED Patient was casted for Foot Orthoses via crush box. Procedure was explained and patient tolerated procedure well. All questions were answered and concerns addressed.  PLAN Insurance to be verified and out of pocket cost communicated to patient. Once cost verified and agreed upon, casts are to be sent to [facility] for fabrication. Patient is to be called for fitting when devices are ready.

## 2021-01-22 NOTE — Progress Notes (Signed)
Subjective:   Patient ID: Debbie Abbott, female   DOB: 65 y.o.   MRN: [id number]   HPI Patient presents stating she is doing better but still having discomfort in her second metatarsal right over left and states that when she tries to be online active it gets sore   ROS      Objective:  Physical Exam  Neurovascular status intact with significant movement of digit digit to right over left with dorsal medial dislocation digit to right with inflammation and moderate discomfort of the second MPJ bilateral     Assessment:  Probability for flexor plate dislocation with inflammatory capsulitis of the second MPJ right over left     Plan:  H&P education rendered concerning the possibility for digital fusion and metatarsal stabilization educating her on procedure and recovery.  At this point I did go ahead recommended orthotics and patient is casted for functional orthotic devices by ped orthotist to offload the second MPJ bilateral and the metatarsals themselves

## 2021-01-23 NOTE — Telephone Encounter (Signed)
error 

## 2021-02-12 ENCOUNTER — Telehealth: Payer: Self-pay | Admitting: Podiatry

## 2021-02-12 DIAGNOSIS — M0609 Rheumatoid arthritis without rheumatoid factor, multiple sites: Secondary | ICD-10-CM | POA: Diagnosis not present

## 2021-02-12 NOTE — Telephone Encounter (Signed)
Orthotics in.. lvm for pt to call to schedule an appt to pick them up. °

## 2021-02-19 ENCOUNTER — Telehealth: Payer: Self-pay | Admitting: Podiatry

## 2021-02-19 NOTE — Telephone Encounter (Signed)
Pt left message stating she got a call last week that her orthotics were in and she was not sure if she could just pick them up or if she needed and appt.  I returned call and left message for pt that she would need an appt and to call me and I can get her scheduled. I also noted that Aaron Edelman has a lot of availability.  She called me right back and is scheduled for 12.20.2022 @ 415

## 2021-02-22 ENCOUNTER — Ambulatory Visit: Payer: Medicare Other

## 2021-02-22 ENCOUNTER — Other Ambulatory Visit: Payer: Self-pay

## 2021-02-22 DIAGNOSIS — M7751 Other enthesopathy of right foot: Secondary | ICD-10-CM

## 2021-02-22 NOTE — Progress Notes (Signed)
SITUATION: Reason for Visit: Fitting and Delivery of Custom Fabricated Foot Orthoses Patient Report: Patient reports comfort and is satisfied with device.  OBJECTIVE DATA: Patient History / Diagnosis:     ICD-10-CM   1. Capsulitis of metatarsophalangeal (MTP) joint of right foot  M77.51       Provided Device: Custom functional foot orthoses   GOAL OF ORTHOSIS - Improve gait - Decrease energy expenditure - Improve Balance - Provide Triplanar stability of foot complex - Facilitate motion  ACTIONS PERFORMED Patient was fit with foot orthoses trimmed to shoe last. Patient tolerated fittign procedure. Device was modified as follows to better fit patient: - Toe plate was trimmed to shoe last  Patient was provided with verbal and written instruction and demonstration regarding donning, doffing, wear, care, proper fit, function, purpose, cleaning, and use of the orthosis and in all related precautions and risks and benefits regarding the orthosis.  Patient was also provided with verbal instruction regarding how to report any failures or malfunctions of the orthosis and necessary follow up care. Patient was also instructed to contact our office regarding any change in status that may affect the function of the orthosis.  Patient demonstrated independence with proper donning, doffing, and fit and verbalized understanding of all instructions.  PLAN: Patient is to follow up in one week or as necessary (PRN). All questions were answered and concerns addressed. Plan of care was discussed with and agreed upon by the patient.

## 2021-03-13 DIAGNOSIS — N1831 Chronic kidney disease, stage 3a: Secondary | ICD-10-CM | POA: Diagnosis not present

## 2021-03-13 DIAGNOSIS — Z86010 Personal history of colonic polyps: Secondary | ICD-10-CM | POA: Diagnosis not present

## 2021-03-13 DIAGNOSIS — I10 Essential (primary) hypertension: Secondary | ICD-10-CM | POA: Diagnosis not present

## 2021-03-13 DIAGNOSIS — Z853 Personal history of malignant neoplasm of breast: Secondary | ICD-10-CM | POA: Diagnosis not present

## 2021-03-13 DIAGNOSIS — M0600 Rheumatoid arthritis without rheumatoid factor, unspecified site: Secondary | ICD-10-CM | POA: Diagnosis not present

## 2021-03-13 DIAGNOSIS — E782 Mixed hyperlipidemia: Secondary | ICD-10-CM | POA: Diagnosis not present

## 2021-03-26 DIAGNOSIS — M25562 Pain in left knee: Secondary | ICD-10-CM | POA: Diagnosis not present

## 2021-04-06 DIAGNOSIS — N644 Mastodynia: Secondary | ICD-10-CM | POA: Diagnosis not present

## 2021-04-06 DIAGNOSIS — C50511 Malignant neoplasm of lower-outer quadrant of right female breast: Secondary | ICD-10-CM | POA: Diagnosis not present

## 2021-04-06 DIAGNOSIS — Z9889 Other specified postprocedural states: Secondary | ICD-10-CM | POA: Diagnosis not present

## 2021-04-09 DIAGNOSIS — M05249 Rheumatoid vasculitis with rheumatoid arthritis of unspecified hand: Secondary | ICD-10-CM | POA: Diagnosis not present

## 2021-04-09 DIAGNOSIS — Z170 Estrogen receptor positive status [ER+]: Secondary | ICD-10-CM | POA: Diagnosis not present

## 2021-04-09 DIAGNOSIS — D0512 Intraductal carcinoma in situ of left breast: Secondary | ICD-10-CM | POA: Diagnosis not present

## 2021-04-09 DIAGNOSIS — M0609 Rheumatoid arthritis without rheumatoid factor, multiple sites: Secondary | ICD-10-CM | POA: Diagnosis not present

## 2021-04-09 DIAGNOSIS — C50511 Malignant neoplasm of lower-outer quadrant of right female breast: Secondary | ICD-10-CM | POA: Diagnosis not present

## 2021-06-04 DIAGNOSIS — Z79899 Other long term (current) drug therapy: Secondary | ICD-10-CM | POA: Diagnosis not present

## 2021-06-04 DIAGNOSIS — M0609 Rheumatoid arthritis without rheumatoid factor, multiple sites: Secondary | ICD-10-CM | POA: Diagnosis not present

## 2021-06-26 DIAGNOSIS — Z79899 Other long term (current) drug therapy: Secondary | ICD-10-CM | POA: Diagnosis not present

## 2021-06-26 DIAGNOSIS — M0609 Rheumatoid arthritis without rheumatoid factor, multiple sites: Secondary | ICD-10-CM | POA: Diagnosis not present

## 2021-06-26 DIAGNOSIS — Z6831 Body mass index (BMI) 31.0-31.9, adult: Secondary | ICD-10-CM | POA: Diagnosis not present

## 2021-06-26 DIAGNOSIS — M1991 Primary osteoarthritis, unspecified site: Secondary | ICD-10-CM | POA: Diagnosis not present

## 2021-06-26 DIAGNOSIS — E669 Obesity, unspecified: Secondary | ICD-10-CM | POA: Diagnosis not present

## 2021-07-31 DIAGNOSIS — M0609 Rheumatoid arthritis without rheumatoid factor, multiple sites: Secondary | ICD-10-CM | POA: Diagnosis not present

## 2021-08-14 DIAGNOSIS — M0600 Rheumatoid arthritis without rheumatoid factor, unspecified site: Secondary | ICD-10-CM | POA: Diagnosis not present

## 2021-08-14 DIAGNOSIS — I10 Essential (primary) hypertension: Secondary | ICD-10-CM | POA: Diagnosis not present

## 2021-08-14 DIAGNOSIS — Z79899 Other long term (current) drug therapy: Secondary | ICD-10-CM | POA: Diagnosis not present

## 2021-08-14 DIAGNOSIS — E7800 Pure hypercholesterolemia, unspecified: Secondary | ICD-10-CM | POA: Diagnosis not present

## 2021-08-14 DIAGNOSIS — C44319 Basal cell carcinoma of skin of other parts of face: Secondary | ICD-10-CM | POA: Diagnosis not present

## 2021-09-25 DIAGNOSIS — M0609 Rheumatoid arthritis without rheumatoid factor, multiple sites: Secondary | ICD-10-CM | POA: Diagnosis not present

## 2021-10-01 DIAGNOSIS — M0689 Other specified rheumatoid arthritis, multiple sites: Secondary | ICD-10-CM | POA: Diagnosis not present

## 2021-10-01 DIAGNOSIS — Z79899 Other long term (current) drug therapy: Secondary | ICD-10-CM | POA: Diagnosis not present

## 2021-10-01 DIAGNOSIS — H2513 Age-related nuclear cataract, bilateral: Secondary | ICD-10-CM | POA: Diagnosis not present

## 2021-11-08 DIAGNOSIS — L239 Allergic contact dermatitis, unspecified cause: Secondary | ICD-10-CM | POA: Diagnosis not present

## 2021-11-13 DIAGNOSIS — Z85828 Personal history of other malignant neoplasm of skin: Secondary | ICD-10-CM | POA: Diagnosis not present

## 2021-11-13 DIAGNOSIS — L821 Other seborrheic keratosis: Secondary | ICD-10-CM | POA: Diagnosis not present

## 2021-11-13 DIAGNOSIS — D2372 Other benign neoplasm of skin of left lower limb, including hip: Secondary | ICD-10-CM | POA: Diagnosis not present

## 2021-11-13 DIAGNOSIS — L814 Other melanin hyperpigmentation: Secondary | ICD-10-CM | POA: Diagnosis not present

## 2021-11-13 DIAGNOSIS — D485 Neoplasm of uncertain behavior of skin: Secondary | ICD-10-CM | POA: Diagnosis not present

## 2021-11-13 DIAGNOSIS — L308 Other specified dermatitis: Secondary | ICD-10-CM | POA: Diagnosis not present

## 2021-11-13 DIAGNOSIS — Z08 Encounter for follow-up examination after completed treatment for malignant neoplasm: Secondary | ICD-10-CM | POA: Diagnosis not present

## 2021-11-13 DIAGNOSIS — D235 Other benign neoplasm of skin of trunk: Secondary | ICD-10-CM | POA: Diagnosis not present

## 2021-11-13 DIAGNOSIS — C4402 Squamous cell carcinoma of skin of lip: Secondary | ICD-10-CM | POA: Diagnosis not present

## 2021-11-20 DIAGNOSIS — M0609 Rheumatoid arthritis without rheumatoid factor, multiple sites: Secondary | ICD-10-CM | POA: Diagnosis not present

## 2021-11-20 DIAGNOSIS — Z79899 Other long term (current) drug therapy: Secondary | ICD-10-CM | POA: Diagnosis not present

## 2021-12-14 DIAGNOSIS — Z79899 Other long term (current) drug therapy: Secondary | ICD-10-CM | POA: Diagnosis not present

## 2021-12-14 DIAGNOSIS — Z6831 Body mass index (BMI) 31.0-31.9, adult: Secondary | ICD-10-CM | POA: Diagnosis not present

## 2021-12-14 DIAGNOSIS — E669 Obesity, unspecified: Secondary | ICD-10-CM | POA: Diagnosis not present

## 2021-12-14 DIAGNOSIS — D72819 Decreased white blood cell count, unspecified: Secondary | ICD-10-CM | POA: Diagnosis not present

## 2021-12-14 DIAGNOSIS — M1991 Primary osteoarthritis, unspecified site: Secondary | ICD-10-CM | POA: Diagnosis not present

## 2021-12-14 DIAGNOSIS — R5383 Other fatigue: Secondary | ICD-10-CM | POA: Diagnosis not present

## 2021-12-14 DIAGNOSIS — M0609 Rheumatoid arthritis without rheumatoid factor, multiple sites: Secondary | ICD-10-CM | POA: Diagnosis not present

## 2021-12-14 DIAGNOSIS — M310 Hypersensitivity angiitis: Secondary | ICD-10-CM | POA: Diagnosis not present

## 2022-01-16 DIAGNOSIS — D0439 Carcinoma in situ of skin of other parts of face: Secondary | ICD-10-CM | POA: Diagnosis not present

## 2022-01-30 DIAGNOSIS — M0609 Rheumatoid arthritis without rheumatoid factor, multiple sites: Secondary | ICD-10-CM | POA: Diagnosis not present

## 2022-02-13 DIAGNOSIS — M0609 Rheumatoid arthritis without rheumatoid factor, multiple sites: Secondary | ICD-10-CM | POA: Diagnosis not present

## 2022-02-27 DIAGNOSIS — M0609 Rheumatoid arthritis without rheumatoid factor, multiple sites: Secondary | ICD-10-CM | POA: Diagnosis not present

## 2022-03-19 DIAGNOSIS — Z6832 Body mass index (BMI) 32.0-32.9, adult: Secondary | ICD-10-CM | POA: Diagnosis not present

## 2022-03-19 DIAGNOSIS — E669 Obesity, unspecified: Secondary | ICD-10-CM | POA: Diagnosis not present

## 2022-03-19 DIAGNOSIS — Z79899 Other long term (current) drug therapy: Secondary | ICD-10-CM | POA: Diagnosis not present

## 2022-03-19 DIAGNOSIS — M310 Hypersensitivity angiitis: Secondary | ICD-10-CM | POA: Diagnosis not present

## 2022-03-19 DIAGNOSIS — M1991 Primary osteoarthritis, unspecified site: Secondary | ICD-10-CM | POA: Diagnosis not present

## 2022-03-19 DIAGNOSIS — M0609 Rheumatoid arthritis without rheumatoid factor, multiple sites: Secondary | ICD-10-CM | POA: Diagnosis not present

## 2022-03-27 DIAGNOSIS — Z79899 Other long term (current) drug therapy: Secondary | ICD-10-CM | POA: Diagnosis not present

## 2022-03-27 DIAGNOSIS — M0609 Rheumatoid arthritis without rheumatoid factor, multiple sites: Secondary | ICD-10-CM | POA: Diagnosis not present

## 2022-04-15 DIAGNOSIS — D123 Benign neoplasm of transverse colon: Secondary | ICD-10-CM | POA: Diagnosis not present

## 2022-04-15 DIAGNOSIS — D122 Benign neoplasm of ascending colon: Secondary | ICD-10-CM | POA: Diagnosis not present

## 2022-04-15 DIAGNOSIS — Z86010 Personal history of colonic polyps: Secondary | ICD-10-CM | POA: Diagnosis not present

## 2022-04-15 DIAGNOSIS — D124 Benign neoplasm of descending colon: Secondary | ICD-10-CM | POA: Diagnosis not present

## 2022-04-15 DIAGNOSIS — K5730 Diverticulosis of large intestine without perforation or abscess without bleeding: Secondary | ICD-10-CM | POA: Diagnosis not present

## 2022-04-15 DIAGNOSIS — Z09 Encounter for follow-up examination after completed treatment for conditions other than malignant neoplasm: Secondary | ICD-10-CM | POA: Diagnosis not present

## 2022-04-15 DIAGNOSIS — K644 Residual hemorrhoidal skin tags: Secondary | ICD-10-CM | POA: Diagnosis not present

## 2022-04-17 DIAGNOSIS — D123 Benign neoplasm of transverse colon: Secondary | ICD-10-CM | POA: Diagnosis not present

## 2022-04-17 DIAGNOSIS — D124 Benign neoplasm of descending colon: Secondary | ICD-10-CM | POA: Diagnosis not present

## 2022-04-17 DIAGNOSIS — D122 Benign neoplasm of ascending colon: Secondary | ICD-10-CM | POA: Diagnosis not present

## 2022-04-24 DIAGNOSIS — M0609 Rheumatoid arthritis without rheumatoid factor, multiple sites: Secondary | ICD-10-CM | POA: Diagnosis not present

## 2022-04-24 DIAGNOSIS — Z79899 Other long term (current) drug therapy: Secondary | ICD-10-CM | POA: Diagnosis not present

## 2022-05-22 DIAGNOSIS — M0609 Rheumatoid arthritis without rheumatoid factor, multiple sites: Secondary | ICD-10-CM | POA: Diagnosis not present

## 2022-05-29 DIAGNOSIS — Z08 Encounter for follow-up examination after completed treatment for malignant neoplasm: Secondary | ICD-10-CM | POA: Diagnosis not present

## 2022-05-29 DIAGNOSIS — C44519 Basal cell carcinoma of skin of other part of trunk: Secondary | ICD-10-CM | POA: Diagnosis not present

## 2022-05-29 DIAGNOSIS — D235 Other benign neoplasm of skin of trunk: Secondary | ICD-10-CM | POA: Diagnosis not present

## 2022-05-29 DIAGNOSIS — L814 Other melanin hyperpigmentation: Secondary | ICD-10-CM | POA: Diagnosis not present

## 2022-05-29 DIAGNOSIS — D485 Neoplasm of uncertain behavior of skin: Secondary | ICD-10-CM | POA: Diagnosis not present

## 2022-05-29 DIAGNOSIS — L821 Other seborrheic keratosis: Secondary | ICD-10-CM | POA: Diagnosis not present

## 2022-05-29 DIAGNOSIS — Z85828 Personal history of other malignant neoplasm of skin: Secondary | ICD-10-CM | POA: Diagnosis not present

## 2022-06-17 DIAGNOSIS — Z6831 Body mass index (BMI) 31.0-31.9, adult: Secondary | ICD-10-CM | POA: Diagnosis not present

## 2022-06-17 DIAGNOSIS — M1991 Primary osteoarthritis, unspecified site: Secondary | ICD-10-CM | POA: Diagnosis not present

## 2022-06-17 DIAGNOSIS — Z79899 Other long term (current) drug therapy: Secondary | ICD-10-CM | POA: Diagnosis not present

## 2022-06-17 DIAGNOSIS — E669 Obesity, unspecified: Secondary | ICD-10-CM | POA: Diagnosis not present

## 2022-06-17 DIAGNOSIS — M0609 Rheumatoid arthritis without rheumatoid factor, multiple sites: Secondary | ICD-10-CM | POA: Diagnosis not present

## 2022-06-17 DIAGNOSIS — M310 Hypersensitivity angiitis: Secondary | ICD-10-CM | POA: Diagnosis not present

## 2022-06-19 DIAGNOSIS — M0609 Rheumatoid arthritis without rheumatoid factor, multiple sites: Secondary | ICD-10-CM | POA: Diagnosis not present

## 2022-06-25 DIAGNOSIS — C44519 Basal cell carcinoma of skin of other part of trunk: Secondary | ICD-10-CM | POA: Diagnosis not present

## 2022-06-25 DIAGNOSIS — L905 Scar conditions and fibrosis of skin: Secondary | ICD-10-CM | POA: Diagnosis not present

## 2022-06-26 ENCOUNTER — Ambulatory Visit: Payer: Medicare Other | Admitting: Podiatry

## 2022-06-30 ENCOUNTER — Encounter (HOSPITAL_BASED_OUTPATIENT_CLINIC_OR_DEPARTMENT_OTHER): Payer: Self-pay | Admitting: Emergency Medicine

## 2022-06-30 ENCOUNTER — Emergency Department (HOSPITAL_BASED_OUTPATIENT_CLINIC_OR_DEPARTMENT_OTHER)
Admission: EM | Admit: 2022-06-30 | Discharge: 2022-06-30 | Disposition: A | Discharge status: Home / Self Care | Payer: Medicare Other | Attending: Emergency Medicine | Admitting: Emergency Medicine

## 2022-06-30 ENCOUNTER — Emergency Department (HOSPITAL_BASED_OUTPATIENT_CLINIC_OR_DEPARTMENT_OTHER): Payer: Medicare Other

## 2022-06-30 ENCOUNTER — Other Ambulatory Visit: Payer: Self-pay

## 2022-06-30 DIAGNOSIS — R112 Nausea with vomiting, unspecified: Secondary | ICD-10-CM | POA: Diagnosis not present

## 2022-06-30 DIAGNOSIS — I10 Essential (primary) hypertension: Secondary | ICD-10-CM | POA: Diagnosis not present

## 2022-06-30 DIAGNOSIS — R0789 Other chest pain: Secondary | ICD-10-CM | POA: Diagnosis not present

## 2022-06-30 DIAGNOSIS — Z79899 Other long term (current) drug therapy: Secondary | ICD-10-CM | POA: Insufficient documentation

## 2022-06-30 DIAGNOSIS — Z853 Personal history of malignant neoplasm of breast: Secondary | ICD-10-CM | POA: Insufficient documentation

## 2022-06-30 DIAGNOSIS — R079 Chest pain, unspecified: Secondary | ICD-10-CM | POA: Diagnosis present

## 2022-06-30 DIAGNOSIS — R197 Diarrhea, unspecified: Secondary | ICD-10-CM | POA: Insufficient documentation

## 2022-06-30 LAB — CBC WITH DIFFERENTIAL/PLATELET
Abs Immature Granulocytes: 0.08 10*3/uL — ABNORMAL HIGH (ref 0.00–0.07)
Basophils Absolute: 0 10*3/uL (ref 0.0–0.1)
Basophils Relative: 0 %
Eosinophils Absolute: 0 10*3/uL (ref 0.0–0.5)
Eosinophils Relative: 0 %
HCT: 39.2 % (ref 36.0–46.0)
Hemoglobin: 13.5 g/dL (ref 12.0–15.0)
Immature Granulocytes: 1 %
Lymphocytes Relative: 1 %
Lymphs Abs: 0.2 10*3/uL — ABNORMAL LOW (ref 0.7–4.0)
MCH: 30.8 pg (ref 26.0–34.0)
MCHC: 34.4 g/dL (ref 30.0–36.0)
MCV: 89.3 fL (ref 80.0–100.0)
Monocytes Absolute: 0.4 10*3/uL (ref 0.1–1.0)
Monocytes Relative: 3 %
Neutro Abs: 12.4 10*3/uL — ABNORMAL HIGH (ref 1.7–7.7)
Neutrophils Relative %: 95 %
Platelets: 164 10*3/uL (ref 150–400)
RBC: 4.39 MIL/uL (ref 3.87–5.11)
RDW: 13.3 % (ref 11.5–15.5)
WBC: 13.1 10*3/uL — ABNORMAL HIGH (ref 4.0–10.5)
nRBC: 0 % (ref 0.0–0.2)

## 2022-06-30 LAB — TROPONIN I (HIGH SENSITIVITY): Troponin I (High Sensitivity): 5 ng/L (ref <18)

## 2022-06-30 LAB — COMPREHENSIVE METABOLIC PANEL
ALT: 16 U/L (ref 0–44)
AST: 16 U/L (ref 15–41)
Albumin: 4.2 g/dL (ref 3.5–5.0)
Alkaline Phosphatase: 103 U/L (ref 38–126)
Anion gap: 13 (ref 5–15)
BUN: 22 mg/dL (ref 8–23)
CO2: 20 mmol/L — ABNORMAL LOW (ref 22–32)
Calcium: 9 mg/dL (ref 8.9–10.3)
Chloride: 105 mmol/L (ref 98–111)
Creatinine, Ser: 1.14 mg/dL — ABNORMAL HIGH (ref 0.44–1.00)
GFR, Estimated: 53 mL/min — ABNORMAL LOW (ref >60)
Glucose, Bld: 147 mg/dL — ABNORMAL HIGH (ref 70–99)
Potassium: 3.6 mmol/L (ref 3.5–5.1)
Sodium: 138 mmol/L (ref 135–145)
Total Bilirubin: 1.2 mg/dL (ref 0.3–1.2)
Total Protein: 6.6 g/dL (ref 6.5–8.1)

## 2022-06-30 LAB — LIPASE, BLOOD: Lipase: <10 U/L — ABNORMAL LOW (ref 11–51)

## 2022-06-30 MED ORDER — ONDANSETRON 4 MG PO TBDP: 4.0000 mg | ORAL_TABLET | Freq: Once | ORAL | Status: DC

## 2022-06-30 MED ORDER — SODIUM CHLORIDE 0.9 % IV BOLUS
1000.0000 mL | Freq: Once | INTRAVENOUS | Status: AC
  Administered 2022-06-30: 1000 mL via INTRAVENOUS

## 2022-06-30 MED ORDER — ONDANSETRON HCL 4 MG/2ML IJ SOLN
4.0000 mg | Freq: Once | INTRAMUSCULAR | Status: AC
  Administered 2022-06-30: 4 mg via INTRAVENOUS
  Filled 2022-06-30: qty 2

## 2022-06-30 MED ORDER — ONDANSETRON 4 MG PO TBDP: 4.0000 mg | ORAL_TABLET | Freq: Three times a day (TID) | ORAL | 0 refills | Status: AC | PRN

## 2022-06-30 NOTE — ED Provider Notes (Signed)
[place] EMERGENCY DEPARTMENT AT [facility] Provider Note   CSN: [id number] Arrival date & time: 06/30/22  1751     History  Chief Complaint  Patient presents with   Chest Pain    Debbie Abbott is a 67 y.o. female.  Patient here with nausea vomiting diarrhea and chest pain.  She is concerned because she had some chest pain after this episode.  Symptoms now resolved.  Should her nausea vomiting diarrhea have stopped after a couple episodes this morning.  Denies any abdominal pain.  No recent antibiotics.  May be suspicious food intake.  Has a history of hypertension and breast cancer but not undergoing active therapy.  Denies any fever or chills.  The history is provided by the patient.       Home Medications Prior to Admission medications   Medication Sig Start Date End Date Taking? Authorizing Provider  ondansetron (ZOFRAN-ODT) 4 MG disintegrating tablet Take 1 tablet (4 mg total) by mouth every 8 (eight) hours as needed for nausea or vomiting. 06/30/22  Yes Maryssa Giampietro, DO  atorvastatin (LIPITOR) 10 MG tablet Take 10 mg by mouth daily.    [provider]  diclofenac (VOLTAREN) 75 MG EC tablet Take 1 tablet (75 mg total) by mouth 2 (two) times daily. 01/10/21   Lenn Sink, DPM  losartan-hydrochlorothiazide (HYZAAR) 50-12.5 MG tablet 11/03/2015 Losartan-Hydrochlorothiazide Oral 50 mg-12.5 mg  PO 1.0 TABLET(S) daily  11/03/2015  active 11/03/15   [provider]      Allergies    Patient has no known allergies.    Review of Systems   Review of Systems  Physical Exam Updated Vital Signs BP 100/61   Pulse 100   Temp 98.6 F (37 C) (Oral)   Resp 19   Ht 5\' 7"  (1.702 m)   Wt 90.7 kg   SpO2 95%   BMI 31.32 kg/m  Physical Exam Vitals and nursing note reviewed.  Constitutional:      General: She is not in acute distress.    Appearance: She is well-developed. She is not ill-appearing.  HENT:     Head: Normocephalic and atraumatic.   Eyes:     Conjunctiva/sclera: Conjunctivae normal.     Pupils: Pupils are equal, round, and reactive to light.  Cardiovascular:     Rate and Rhythm: Normal rate and regular rhythm.     Heart sounds: Normal heart sounds. No murmur heard. Pulmonary:     Effort: Pulmonary effort is normal. No respiratory distress.     Breath sounds: Normal breath sounds.  Abdominal:     Palpations: Abdomen is soft.     Tenderness: There is no abdominal tenderness.  Musculoskeletal:        General: No swelling.     Cervical back: Normal range of motion and neck supple.  Skin:    General: Skin is warm and dry.     Capillary Refill: Capillary refill takes less than 2 seconds.  Neurological:     Mental Status: She is alert.  Psychiatric:        Mood and Affect: Mood normal.     ED Results / Procedures / Treatments   Labs (all labs ordered are listed, but only abnormal results are displayed) Labs Reviewed  CBC WITH DIFFERENTIAL/PLATELET - Abnormal; Notable for the following components:      Result Value   WBC 13.1 (*)    Neutro Abs 12.4 (*)    Lymphs Abs 0.2 (*)  Abs Immature Granulocytes 0.08 (*)    All other components within normal limits  COMPREHENSIVE METABOLIC PANEL - Abnormal; Notable for the following components:   CO2 20 (*)    Glucose, Bld 147 (*)    Creatinine, Ser 1.14 (*)    GFR, Estimated 53 (*)    All other components within normal limits  LIPASE, BLOOD - Abnormal; Notable for the following components:   Lipase <10 (*)    All other components within normal limits  TROPONIN I (HIGH SENSITIVITY)    EKG EKG Interpretation  Date/Time:  Sunday June 30 2022 18:14:14 EDT Ventricular Rate:  102 PR Interval:  138 QRS Duration: 76 QT Interval:  364 QTC Calculation: 474 R Axis:   38 Text Interpretation: Sinus tachycardia No previous ECGs available Confirmed by Tishawna Larouche ([id number]) on 06/30/2022 6:17:43 PM  Radiology DG Chest Portable 1 View  Result Date:  06/30/2022 CLINICAL DATA:  Chest pain EXAM: PORTABLE CHEST 1 VIEW COMPARISON:  None Available. FINDINGS: The heart size and mediastinal contours are within normal limits. Both lungs are clear. The visualized skeletal structures are unremarkable. IMPRESSION: No active disease. Electronically Signed   By: Amy  Guttmann M.D.   On: 06/30/2022 19:16    Procedures Procedures    Medications Ordered in ED Medications  sodium chloride 0.9 % bolus 1,000 mL (1,000 mLs Intravenous New Bag/Given 06/30/22 1904)  ondansetron (ZOFRAN) injection 4 mg (4 mg Intravenous Given 06/30/22 1905)    ED Course/ Medical Decision Making/ A&P                             Medical Decision Making Amount and/or Complexity of Data Reviewed Labs: ordered. Radiology: ordered.  Risk Prescription drug management.   Debbie Abbott is here with nausea vomiting diarrhea.  Also chest pain.  Normal vitals.  No fever.  Overall suspect viral GI illness, seems less likely to be ACS or pneumonia.  No concern for PE or intra-abdominal infection.  She is asymptomatic now.  She is well-appearing.  Will give IV fluids and IV Zofran.  EKG shows sinus rhythm.  No ischemic changes.  Troponins normal.  Doubt ACS.  No significant electrolyte abnormality or kidney injury or leukocytosis.  Overall she is feeling great after IV fluids.  Discharged in good condition.  Understands return precautions.  Overall suspect viral GI illness.  This chart was dictated using voice recognition software.  Despite best efforts to proofread,  errors can occur which can change the documentation meaning.         Final Clinical Impression(s) / ED Diagnoses Final diagnoses:  Atypical chest pain  Nausea vomiting and diarrhea    Rx / DC Orders ED Discharge Orders          Ordered    ondansetron (ZOFRAN-ODT) 4 MG disintegrating tablet  Every 8 hours PRN        04 /28/24 1919              Virgina Norfolk, DO 06/30/22 1919

## 2022-06-30 NOTE — ED Triage Notes (Signed)
Pt via pov from home with chest pain that has resolved. She reports that she awakened with emesis and diarrhea today (which has also resolved) and then the pain came, stayed a while, and then left. She reports that she had a basal cell cancer removed a few days ago and that it really hurt last night. Pt alert & oriented, nad noted.

## 2022-07-03 ENCOUNTER — Ambulatory Visit: Payer: Medicare Other | Admitting: Podiatry

## 2022-07-17 DIAGNOSIS — M0609 Rheumatoid arthritis without rheumatoid factor, multiple sites: Secondary | ICD-10-CM | POA: Diagnosis not present

## 2022-08-14 DIAGNOSIS — M0609 Rheumatoid arthritis without rheumatoid factor, multiple sites: Secondary | ICD-10-CM | POA: Diagnosis not present

## 2022-08-27 DIAGNOSIS — E782 Mixed hyperlipidemia: Secondary | ICD-10-CM | POA: Diagnosis not present

## 2022-08-27 DIAGNOSIS — Z853 Personal history of malignant neoplasm of breast: Secondary | ICD-10-CM | POA: Diagnosis not present

## 2022-08-27 DIAGNOSIS — I10 Essential (primary) hypertension: Secondary | ICD-10-CM | POA: Diagnosis not present

## 2022-08-27 DIAGNOSIS — M159 Polyosteoarthritis, unspecified: Secondary | ICD-10-CM | POA: Diagnosis not present

## 2022-08-27 DIAGNOSIS — M0600 Rheumatoid arthritis without rheumatoid factor, unspecified site: Secondary | ICD-10-CM | POA: Diagnosis not present

## 2022-08-27 DIAGNOSIS — Z1231 Encounter for screening mammogram for malignant neoplasm of breast: Secondary | ICD-10-CM | POA: Diagnosis not present

## 2022-08-27 DIAGNOSIS — Z1501 Genetic susceptibility to malignant neoplasm of breast: Secondary | ICD-10-CM | POA: Diagnosis not present

## 2022-08-30 ENCOUNTER — Other Ambulatory Visit: Payer: Self-pay | Admitting: Internal Medicine

## 2022-08-30 DIAGNOSIS — Z1231 Encounter for screening mammogram for malignant neoplasm of breast: Secondary | ICD-10-CM

## 2022-09-11 DIAGNOSIS — M0609 Rheumatoid arthritis without rheumatoid factor, multiple sites: Secondary | ICD-10-CM | POA: Diagnosis not present

## 2022-10-09 DIAGNOSIS — M0609 Rheumatoid arthritis without rheumatoid factor, multiple sites: Secondary | ICD-10-CM | POA: Diagnosis not present

## 2022-10-09 DIAGNOSIS — Z79899 Other long term (current) drug therapy: Secondary | ICD-10-CM | POA: Diagnosis not present

## 2022-10-16 ENCOUNTER — Ambulatory Visit (INDEPENDENT_AMBULATORY_CARE_PROVIDER_SITE_OTHER): Payer: Medicare Other | Admitting: Ophthalmology

## 2022-10-16 ENCOUNTER — Encounter (INDEPENDENT_AMBULATORY_CARE_PROVIDER_SITE_OTHER): Payer: Self-pay | Admitting: Ophthalmology

## 2022-10-16 DIAGNOSIS — H25813 Combined forms of age-related cataract, bilateral: Secondary | ICD-10-CM | POA: Diagnosis not present

## 2022-10-16 DIAGNOSIS — Z79899 Other long term (current) drug therapy: Secondary | ICD-10-CM | POA: Diagnosis not present

## 2022-10-16 DIAGNOSIS — H35033 Hypertensive retinopathy, bilateral: Secondary | ICD-10-CM | POA: Diagnosis not present

## 2022-10-16 DIAGNOSIS — H33312 Horseshoe tear of retina without detachment, left eye: Secondary | ICD-10-CM | POA: Diagnosis not present

## 2022-10-16 DIAGNOSIS — M0689 Other specified rheumatoid arthritis, multiple sites: Secondary | ICD-10-CM | POA: Diagnosis not present

## 2022-10-16 DIAGNOSIS — H3322 Serous retinal detachment, left eye: Secondary | ICD-10-CM

## 2022-10-16 DIAGNOSIS — I10 Essential (primary) hypertension: Secondary | ICD-10-CM | POA: Diagnosis not present

## 2022-10-16 DIAGNOSIS — H2513 Age-related nuclear cataract, bilateral: Secondary | ICD-10-CM | POA: Diagnosis not present

## 2022-10-16 MED ORDER — PREDNISOLONE ACETATE 1 % OP SUSP: 1.0000 [drp] | Freq: Four times a day (QID) | OPHTHALMIC | 0 refills | Status: AC

## 2022-10-16 NOTE — Progress Notes (Signed)
[facility] - Clinic Note  10/16/2022   CHIEF COMPLAINT Patient presents for Retina Evaluation  HISTORY OF PRESENT ILLNESS: Debbie Abbott is a 67 y.o. female who presents to the clinic today for:  HPI     Retina Evaluation   Associated Symptoms Negative for Flashes, Floaters and Blind Spot.  I, the attending physician,  performed the HPI with the patient and updated documentation appropriately.        Comments   Pt here on the referral of Dr. Fabian Sharp for concern of HST OS, pt saw him this morning for a routine eye exam / cat eval, pt denies fol or floaters, she has one constant floater, but nothing new, pt is not diabetic and does not use any drops      Last edited by Rennis Chris, MD on 10/16/2022 12:21 PM.    Pt is here on the referral of Dr. Fabian Sharp for concern of HST OS, pt saw him for a routine exam and to talk about cataract surgery, she denies fol and states she only has one constant floater that she's had for "years", she does not use any drops, pt endorses being hypertensive, but denies having diabetes   Referring physician: Olivia Canter, MD [address] [place],  [place] [address]  HISTORICAL INFORMATION:  Selected notes from the MEDICAL RECORD NUMBER Referred by Dr. Fabian Sharp for retinal tear OS LEE:  Ocular Hx- PMH-   CURRENT MEDICATIONS: Current Outpatient Medications (Ophthalmic Drugs)  Medication Sig   prednisoLONE acetate (PRED FORTE) 1 % ophthalmic suspension Place 1 drop into the left eye 4 (four) times daily for 7 days.   No current facility-administered medications for this visit. (Ophthalmic Drugs)   Current Outpatient Medications (Other)  Medication Sig   atorvastatin (LIPITOR) 10 MG tablet Take 10 mg by mouth daily.   diclofenac (VOLTAREN) 75 MG EC tablet Take 1 tablet (75 mg total) by mouth 2 (two) times daily.   losartan-hydrochlorothiazide (HYZAAR) 50-12.5 MG tablet 11/03/2015  Losartan-Hydrochlorothiazide Oral 50 mg-12.5 mg  PO 1.0 TABLET(S) daily  11/03/2015  active   ondansetron (ZOFRAN-ODT) 4 MG disintegrating tablet Take 1 tablet (4 mg total) by mouth every 8 (eight) hours as needed for nausea or vomiting.   No current facility-administered medications for this visit. (Other)   REVIEW OF SYSTEMS: ROS   Positive for: Cardiovascular, Eyes Negative for: Constitutional, Gastrointestinal, Neurological, Skin, Genitourinary, Musculoskeletal, HENT, Endocrine, Respiratory, Psychiatric, Allergic/Imm, Heme/Lymph Last edited by Posey Boyer, COT on 10/16/2022 12:10 PM.     ALLERGIES No Known Allergies PAST MEDICAL HISTORY Past Medical History:  Diagnosis Date   Breast cancer (HCC)    Hypertension    Past Surgical History:  Procedure Laterality Date   BREAST LUMPECTOMY Right    BREAST LUMPECTOMY Left    skin cancer removal     FAMILY HISTORY Family History  Problem Relation Age of Onset   Breast cancer Sister 64   Breast cancer Paternal Aunt    SOCIAL HISTORY Social History   Tobacco Use   Smoking status: Former    Current packs/day: 0.00    Average packs/day: 1.5 packs/day for 14.0 years (21.0 ttl pk-yrs)    Types: Cigarettes    Start date: 03/04/1974    Quit date: 03/04/1988    Years since quitting: 34.6  Vaping Use   Vaping status: Never Used  Substance Use Topics   Alcohol use: Yes    Alcohol/week: 2.0  standard drinks of alcohol    Types: 1 Glasses of wine, 1 Shots of liquor per week    Comment: occasionally   Drug use: Never       OPHTHALMIC EXAM:  Base Eye Exam     Visual Acuity (Snellen - Linear)       Right Left   Dist cc 20/30 -2 20/50 +2   Dist ph cc 20/30 +2 20/30 +2    Correction: Glasses         Tonometry (Tonopen, 12:15 PM)       Right Left   Pressure 19 17         Pupils       Dark   Right dilated   Left          Visual Fields       Left Right    Full Full         Extraocular Movement        Right Left    Full, Ortho Full, Ortho         Neuro/Psych     Oriented x3: Yes   Mood/Affect: Normal           Slit Lamp and Fundus Exam     Slit Lamp Exam       Right Left   Lids/Lashes Dermatochalasis - upper lid, mild MGD Dermatochalasis - upper lid, mild MGD   Conjunctiva/Sclera White and quiet White and quiet   Cornea 2-3+ Punctate epithelial erosions, irregular epi, decreased TBUT 2-3+ Punctate epithelial erosions, irregular epi, decreased TBUT   Anterior Chamber deep and clear deep and clear   Iris Round and dilated Round and dilated   Lens 2-3+ Nuclear sclerosis, 2-3+ Cortical cataract 2-3+ Nuclear sclerosis, 2-3+ Cortical cataract   Anterior Vitreous mild syneresis mild syneresis, no pigment         Fundus Exam       Right Left   Disc Pink and Sharp Pink and Sharp, mild PPA   C/D Ratio 0.2 0.2   Macula Flat, Blunted foveal reflex, RPE mottling, No heme or edema Flat, Blunted foveal reflex, RPE mottling, No heme or edema   Vessels attenuated, Tortuous attenuated, mild tortuosity   Periphery Attached; no heme Peripheral HST at 1000 with +SRF extending from 0900-1000 -- mild pigmented demarcation line surrounding SRF; focal CR atrophy at nasal edge of tear -- old operculated hole           IMAGING AND PROCEDURES  Imaging and Procedures for 10/16/2022  OCT, Retina - OU - Both Eyes       Right Eye Quality was good. Central Foveal Thickness: 293. Progression has no prior data. Findings include normal foveal contour, no IRF, no SRF.   Left Eye Quality was good. Central Foveal Thickness: 300. Progression has no prior data. Findings include normal foveal contour, no IRF (Shallow SRF nasal periphery caught on widefield).   Notes *Images captured and stored on drive  Diagnosis / Impression:  OD: NFP, no IRF/SRF OS: NFP, no IRF, +SRF nasal periphery caught on widefield  Clinical management:  See below  Abbreviations: NFP - Normal foveal profile. CME -  cystoid macular edema. PED - pigment epithelial detachment. IRF - intraretinal fluid. SRF - subretinal fluid. EZ - ellipsoid zone. ERM - epiretinal membrane. ORA - outer retinal atrophy. ORT - outer retinal tubulation. SRHM - subretinal hyper-reflective material. IRHM - intraretinal hyper-reflective material      Color Fundus Photography Optos - OU -  Both Eyes       Right Eye Progression has no prior data. Disc findings include normal observations. Macula : normal observations. Vessels : normal observations.   Left Eye Progression has no prior data. Disc findings include normal observations. Macula : normal observations. Vessels : normal observations. Periphery : detachment, tear (Large tear at 1030 with surrounding SRF, focal CR atrophy at nasal border of detachment).   Notes **Images stored on drive**  Impression: OD: normal study OS: Large tear at 1030 with surrounding SRF, focal CR atrophy at nasal border of detachment      Repair Retinal Detach, Photocoag - OS - Left Eye       LASER PROCEDURE NOTE  Procedure:  Barrier laser retinopexy using slit lamp laser, LEFT eye   Diagnosis:   Retinal tear w/ surrounding SRF / focal retinal detachment, LEFT eye                     Flap tear at 10 o'clock w/ peripheral SRF spanning 9-10 oclock  Surgeon: Rennis Chris, MD, PhD  Anesthesia: Topical  Informed consent obtained, operative eye marked, and time out performed prior to initiation of laser.   Laser settings:  Lumenis Smart532 laser, slit lamp Lens: Mainster PRP 165 Power: 320 mW Spot size: 200 microns Duration: 40 msec  # spots: 425  Placement of laser: Using a Mainster PRP 165 contact lens at the slit lamp, laser was placed in three+ confluent rows around focal retinal detachment w/ horseshoe tear spanning 0900-1000 anterior to equator. Laser indirect ophthalmoscopy w/ scleral depression was used to complete the most anterior portions of the laser retinopexy: 420 spots,  290 mW power, 70 ms duration.  Complications: None.  Patient tolerated the procedure well and received written and verbal post-procedure care information/education.          ASSESSMENT/PLAN:   ICD-10-CM   1. Retinal tear of left eye  H33.312 Color Fundus Photography Optos - OU - Both Eyes    Repair Retinal Detach, Photocoag - OS - Left Eye    2. Left retinal detachment  H33.22 Repair Retinal Detach, Photocoag - OS - Left Eye    3. Essential hypertension  I10     4. Hypertensive retinopathy of both eyes  H35.033 OCT, Retina - OU - Both Eyes    5. Combined forms of age-related cataract of both eyes  H25.813      1,2. Retinal tear with focal, peripheral retinal detachment OS - asymptomatic horseshoe tear at 1000 with focal peripheral retinal detachment from 0900 to 1000 -- found on routine dilated exam - The incidence, risk factors, and natural history of retinal detachment was discussed with patient.   - Potential treatment options including delimiting laser, pneumatic retinopexy, scleral buckle, and vitrectomy, cryotherapy and laser, and the use of air, gas, and oil discussed with patient. - The risks of blindness, loss of vision, infection, hemorrhage, cataract progression or lens displacement were discussed with patient. - recommend laser retinopexy OS today, 08.14.24 - pt wishes to proceed with laser - RBA of procedure discussed, questions answered - informed consent obtained and signed - start PF QID OS x7 days - see procedure note  3,4. Hypertensive retinopathy OU - discussed importance of tight BP control - monitor  5. Mixed Cataract OU - The symptoms of cataract, surgical options, and treatments and risks were discussed with patient. - discussed diagnosis and progression - under the expert management of Dr. Fabian Sharp  Ophthalmic Meds  Ordered this visit:  Meds ordered this encounter  Medications   prednisoLONE acetate (PRED FORTE) 1 % ophthalmic suspension     Sig: Place 1 drop into the left eye 4 (four) times daily for 7 days.    Dispense:  5 mL    Refill:  0     Return in about 1 week (around 10/23/2022) for f/u RD / retinal tear OS, DFE, OCT.  There are no Patient Instructions on file for this visit.  Explained the diagnoses, plan, and follow up with the patient and they expressed understanding.  Patient expressed understanding of the importance of proper follow up care.   This document serves as a record of services personally performed by Karie Chimera, MD, PhD. It was created on their behalf by Glee Arvin. Manson Passey, OA an ophthalmic technician. The creation of this record is the provider's dictation and/or activities during the visit.    Electronically signed by: Glee Arvin. Manson Passey, OA 10/16/22 4:32 PM  Karie Chimera, M.D., Ph.D. Diseases & Surgery of the Retina and Vitreous [facility] 10/16/2022  I have reviewed the above documentation for accuracy and completeness, and I agree with the above. Karie Chimera, M.D., Ph.D. 10/16/22 4:32 PM   Abbreviations: M myopia (nearsighted); A astigmatism; H hyperopia (farsighted); P presbyopia; Mrx spectacle prescription;  CTL contact lenses; OD right eye; OS left eye; OU both eyes  XT exotropia; ET esotropia; PEK punctate epithelial keratitis; PEE punctate epithelial erosions; DES dry eye syndrome; MGD meibomian gland dysfunction; ATs artificial tears; PFAT's preservative free artificial tears; NSC nuclear sclerotic cataract; PSC posterior subcapsular cataract; ERM epi-retinal membrane; PVD posterior vitreous detachment; RD retinal detachment; DM diabetes mellitus; DR diabetic retinopathy; NPDR non-proliferative diabetic retinopathy; PDR proliferative diabetic retinopathy; CSME clinically significant macular edema; DME diabetic macular edema; dbh dot blot hemorrhages; CWS cotton wool spot; POAG primary open angle glaucoma; C/D cup-to-disc ratio; HVF humphrey visual field; GVF goldmann  visual field; OCT optical coherence tomography; IOP intraocular pressure; BRVO Branch retinal vein occlusion; CRVO central retinal vein occlusion; CRAO central retinal artery occlusion; BRAO branch retinal artery occlusion; RT retinal tear; SB scleral buckle; PPV pars plana vitrectomy; VH Vitreous hemorrhage; PRP panretinal laser photocoagulation; IVK intravitreal kenalog; VMT vitreomacular traction; MH Macular hole;  NVD neovascularization of the disc; NVE neovascularization elsewhere; AREDS age related eye disease study; ARMD age related macular degeneration; POAG primary open angle glaucoma; EBMD epithelial/anterior basement membrane dystrophy; ACIOL anterior chamber intraocular lens; IOL intraocular lens; PCIOL posterior chamber intraocular lens; Phaco/IOL phacoemulsification with intraocular lens placement; PRK photorefractive keratectomy; LASIK laser assisted in situ keratomileusis; HTN hypertension; DM diabetes mellitus; COPD chronic obstructive pulmonary disease

## 2022-10-21 NOTE — Progress Notes (Signed)
[facility] - Clinic Note  10/23/2022   CHIEF COMPLAINT Patient presents for Retina Follow Up  HISTORY OF PRESENT ILLNESS: Debbie Abbott is a 67 y.o. female who presents to the clinic today for:  HPI     Retina Follow Up   Patient presents with  Retinal Break/Detachment.  In left eye.  This started 1 week ago.  Duration of 1 week.  Since onset it is stable.  I, the attending physician,  performed the HPI with the patient and updated documentation appropriately.        Comments   1 week retina follow up RD/tear OS pt is reporting no vision changes noticed she denies any flashes but is seeing some floaters she is using PF qid OS       Last edited by Rennis Chris, MD on 10/24/2022 12:00 PM.    Pt denies seeing fol, but is still seeing floaters OS, still using PF QID OS  Referring physician: Kendrick Ranch, MD [address] [place],  [place] [address]  HISTORICAL INFORMATION:  Selected notes from the MEDICAL RECORD NUMBER Referred by Dr. Fabian Sharp for retinal tear OS LEE:  Ocular Hx- PMH-   CURRENT MEDICATIONS: No current outpatient medications on file. (Ophthalmic Drugs)   No current facility-administered medications for this visit. (Ophthalmic Drugs)   Current Outpatient Medications (Other)  Medication Sig   atorvastatin (LIPITOR) 10 MG tablet Take 10 mg by mouth daily.   diclofenac (VOLTAREN) 75 MG EC tablet Take 1 tablet (75 mg total) by mouth 2 (two) times daily.   losartan-hydrochlorothiazide (HYZAAR) 50-12.5 MG tablet 11/03/2015 Losartan-Hydrochlorothiazide Oral 50 mg-12.5 mg  PO 1.0 TABLET(S) daily  11/03/2015  active   ondansetron (ZOFRAN-ODT) 4 MG disintegrating tablet Take 1 tablet (4 mg total) by mouth every 8 (eight) hours as needed for nausea or vomiting.   No current facility-administered medications for this visit. (Other)   REVIEW OF SYSTEMS: ROS   Positive for: Cardiovascular, Eyes Negative for:  Constitutional, Gastrointestinal, Neurological, Skin, Genitourinary, Musculoskeletal, HENT, Endocrine, Respiratory, Psychiatric, Allergic/Imm, Heme/Lymph Last edited by Etheleen Mayhew, COT on 10/23/2022 12:57 PM.      ALLERGIES No Known Allergies PAST MEDICAL HISTORY Past Medical History:  Diagnosis Date   Breast cancer (HCC)    Hypertension    Past Surgical History:  Procedure Laterality Date   BREAST LUMPECTOMY Right    BREAST LUMPECTOMY Left    skin cancer removal     FAMILY HISTORY Family History  Problem Relation Age of Onset   Breast cancer Sister 32   Breast cancer Paternal Aunt    SOCIAL HISTORY Social History   Tobacco Use   Smoking status: Former    Current packs/day: 0.00    Average packs/day: 1.5 packs/day for 14.0 years (21.0 ttl pk-yrs)    Types: Cigarettes    Start date: 03/04/1974    Quit date: 03/04/1988    Years since quitting: 34.6  Vaping Use   Vaping status: Never Used  Substance Use Topics   Alcohol use: Yes    Alcohol/week: 2.0 standard drinks of alcohol    Types: 1 Glasses of wine, 1 Shots of liquor per week    Comment: occasionally   Drug use: Never       OPHTHALMIC EXAM:  Base Eye Exam     Visual Acuity (Snellen - Linear)       Right Left   Dist cc 20/30 -1 20/40   Dist  ph cc 20/25 -2 20/40 +1         Tonometry (Tonopen, 1:02 PM)       Right Left   Pressure 11 11         Pupils       Pupils Dark Light Shape React APD   Right PERRL 3 2 Round Brisk None   Left PERRL 3 2 Round Brisk None         Visual Fields       Left Right    Full Full         Extraocular Movement       Right Left    Full, Ortho Full, Ortho         Neuro/Psych     Oriented x3: Yes   Mood/Affect: Normal         Dilation     Both eyes: 2.5% Phenylephrine @ 1:02 PM           Slit Lamp and Fundus Exam     Slit Lamp Exam       Right Left   Lids/Lashes Dermatochalasis - upper lid, mild MGD Dermatochalasis - upper  lid, mild MGD   Conjunctiva/Sclera White and quiet White and quiet   Cornea 2-3+ Punctate epithelial erosions, irregular epi, decreased TBUT arcus, trace tear film debris   Anterior Chamber deep and clear deep and clear   Iris Round and dilated Round and dilated   Lens 2-3+ Nuclear sclerosis, 2-3+ Cortical cataract 2-3+ Nuclear sclerosis, 2-3+ Cortical cataract   Anterior Vitreous mild syneresis mild syneresis, no pigment, Posterior vitreous detachment, vitreous condensations         Fundus Exam       Right Left   Disc Pink and Sharp Pink and Sharp, mild PPP   C/D Ratio 0.2 0.3   Macula Flat, Blunted foveal reflex, RPE mottling, No heme or edema Flat, Blunted foveal reflex, RPE mottling, No heme or edema   Vessels attenuated, Tortuous attenuated, Tortuous   Periphery Attached; no heme Peripheral HST at 1000 with +SRF extending from 0900-1000 -- mild pigmented demarcation line surrounding SRF -- good early laser changes surrounding; focal CR atrophy at nasal edge of tear -- old operculated hole           IMAGING AND PROCEDURES  Imaging and Procedures for 10/23/2022  OCT, Retina - OU - Both Eyes       Right Eye Quality was good. Central Foveal Thickness: 289. Progression has been stable. Findings include normal foveal contour, no IRF, no SRF.   Left Eye Quality was good. Central Foveal Thickness: 300. Progression has been stable. Findings include normal foveal contour, no IRF (Shallow SRF nasal periphery caught on widefield -- not imaged today, mild vitreous opacities).   Notes *Images captured and stored on drive  Diagnosis / Impression:  OD: NFP, no IRF/SRF OS: NFP, no IRF, +Shallow SRF nasal periphery caught on widefield -- not imaged today, mild vitreous opacities  Clinical management:  See below  Abbreviations: NFP - Normal foveal profile. CME - cystoid macular edema. PED - pigment epithelial detachment. IRF - intraretinal fluid. SRF - subretinal fluid. EZ - ellipsoid  zone. ERM - epiretinal membrane. ORA - outer retinal atrophy. ORT - outer retinal tubulation. SRHM - subretinal hyper-reflective material. IRHM - intraretinal hyper-reflective material           ASSESSMENT/PLAN:   ICD-10-CM   1. Retinal tear of left eye  H33.312 OCT, Retina - OU - Both  Eyes    2. Left retinal detachment  H33.22     3. Essential hypertension  I10     4. Hypertensive retinopathy of both eyes  H35.033     5. Combined forms of age-related cataract of both eyes  H25.813      1,2. Retinal tear with focal, peripheral retinal detachment OS - asymptomatic horseshoe tear at 1000 with focal peripheral retinal detachment from 0900 to 1000 -- found on routine dilated exam - s/p laser retinopexy OS (08.14.24) -- good early laser changes surrounding - okay to stop PF today - f/u 2-3 weeks, DFE, OCT -- scan through detachment (0900-1000)  3,4. Hypertensive retinopathy OU - discussed importance of tight BP control - monitor  5. Mixed Cataract OU - The symptoms of cataract, surgical options, and treatments and risks were discussed with patient. - discussed diagnosis and progression - under the expert management of Dr. Fabian Sharp  Ophthalmic Meds Ordered this visit:  No orders of the defined types were placed in this encounter.    Return for fu 2-3 weeks, retinal tear / RD OS, DFE, OCT.  There are no Patient Instructions on file for this visit.  Explained the diagnoses, plan, and follow up with the patient and they expressed understanding.  Patient expressed understanding of the importance of proper follow up care.   This document serves as a record of services personally performed by Karie Chimera, MD, PhD. It was created on their behalf by De Blanch, an ophthalmic technician. The creation of this record is the provider's dictation and/or activities during the visit.    Electronically signed by: De Blanch, OA, 10/24/22  12:01 PM  This document serves  as a record of services personally performed by Karie Chimera, MD, PhD. It was created on their behalf by Glee Arvin. Manson Passey, OA an ophthalmic technician. The creation of this record is the provider's dictation and/or activities during the visit.    Electronically signed by: Glee Arvin. Manson Passey, OA 10/24/22 12:01 PM  Karie Chimera, M.D., Ph.D. Diseases & Surgery of the Retina and Vitreous [facility] 10/23/2022  I have reviewed the above documentation for accuracy and completeness, and I agree with the above. Karie Chimera, M.D., Ph.D. 10/24/22 12:12 PM   Abbreviations: M myopia (nearsighted); A astigmatism; H hyperopia (farsighted); P presbyopia; Mrx spectacle prescription;  CTL contact lenses; OD right eye; OS left eye; OU both eyes  XT exotropia; ET esotropia; PEK punctate epithelial keratitis; PEE punctate epithelial erosions; DES dry eye syndrome; MGD meibomian gland dysfunction; ATs artificial tears; PFAT's preservative free artificial tears; NSC nuclear sclerotic cataract; PSC posterior subcapsular cataract; ERM epi-retinal membrane; PVD posterior vitreous detachment; RD retinal detachment; DM diabetes mellitus; DR diabetic retinopathy; NPDR non-proliferative diabetic retinopathy; PDR proliferative diabetic retinopathy; CSME clinically significant macular edema; DME diabetic macular edema; dbh dot blot hemorrhages; CWS cotton wool spot; POAG primary open angle glaucoma; C/D cup-to-disc ratio; HVF humphrey visual field; GVF goldmann visual field; OCT optical coherence tomography; IOP intraocular pressure; BRVO Branch retinal vein occlusion; CRVO central retinal vein occlusion; CRAO central retinal artery occlusion; BRAO branch retinal artery occlusion; RT retinal tear; SB scleral buckle; PPV pars plana vitrectomy; VH Vitreous hemorrhage; PRP panretinal laser photocoagulation; IVK intravitreal kenalog; VMT vitreomacular traction; MH Macular hole;  NVD neovascularization of the  disc; NVE neovascularization elsewhere; AREDS age related eye disease study; ARMD age related macular degeneration; POAG primary open angle glaucoma; EBMD epithelial/anterior basement membrane dystrophy; ACIOL anterior  chamber intraocular lens; IOL intraocular lens; PCIOL posterior chamber intraocular lens; Phaco/IOL phacoemulsification with intraocular lens placement; PRK photorefractive keratectomy; LASIK laser assisted in situ keratomileusis; HTN hypertension; DM diabetes mellitus; COPD chronic obstructive pulmonary disease

## 2022-10-23 ENCOUNTER — Encounter (INDEPENDENT_AMBULATORY_CARE_PROVIDER_SITE_OTHER): Payer: Self-pay | Admitting: Ophthalmology

## 2022-10-23 ENCOUNTER — Ambulatory Visit (INDEPENDENT_AMBULATORY_CARE_PROVIDER_SITE_OTHER): Payer: Medicare Other | Admitting: Ophthalmology

## 2022-10-23 DIAGNOSIS — H3322 Serous retinal detachment, left eye: Secondary | ICD-10-CM

## 2022-10-23 DIAGNOSIS — H35033 Hypertensive retinopathy, bilateral: Secondary | ICD-10-CM | POA: Diagnosis not present

## 2022-10-23 DIAGNOSIS — I10 Essential (primary) hypertension: Secondary | ICD-10-CM

## 2022-10-23 DIAGNOSIS — H25813 Combined forms of age-related cataract, bilateral: Secondary | ICD-10-CM

## 2022-10-23 DIAGNOSIS — H33312 Horseshoe tear of retina without detachment, left eye: Secondary | ICD-10-CM

## 2022-10-24 ENCOUNTER — Encounter (INDEPENDENT_AMBULATORY_CARE_PROVIDER_SITE_OTHER): Payer: Self-pay | Admitting: Ophthalmology

## 2022-11-06 DIAGNOSIS — Z853 Personal history of malignant neoplasm of breast: Secondary | ICD-10-CM | POA: Diagnosis not present

## 2022-11-06 DIAGNOSIS — M0600 Rheumatoid arthritis without rheumatoid factor, unspecified site: Secondary | ICD-10-CM | POA: Diagnosis not present

## 2022-11-06 DIAGNOSIS — M0609 Rheumatoid arthritis without rheumatoid factor, multiple sites: Secondary | ICD-10-CM | POA: Diagnosis not present

## 2022-11-06 DIAGNOSIS — I10 Essential (primary) hypertension: Secondary | ICD-10-CM | POA: Diagnosis not present

## 2022-11-06 DIAGNOSIS — E782 Mixed hyperlipidemia: Secondary | ICD-10-CM | POA: Diagnosis not present

## 2022-11-06 DIAGNOSIS — H35033 Hypertensive retinopathy, bilateral: Secondary | ICD-10-CM | POA: Diagnosis not present

## 2022-11-06 DIAGNOSIS — Z86010 Personal history of colonic polyps: Secondary | ICD-10-CM | POA: Diagnosis not present

## 2022-11-06 DIAGNOSIS — N1831 Chronic kidney disease, stage 3a: Secondary | ICD-10-CM | POA: Diagnosis not present

## 2022-11-06 DIAGNOSIS — Z23 Encounter for immunization: Secondary | ICD-10-CM | POA: Diagnosis not present

## 2022-11-06 DIAGNOSIS — C44319 Basal cell carcinoma of skin of other parts of face: Secondary | ICD-10-CM | POA: Diagnosis not present

## 2022-11-06 DIAGNOSIS — E559 Vitamin D deficiency, unspecified: Secondary | ICD-10-CM | POA: Diagnosis not present

## 2022-11-06 DIAGNOSIS — Z1501 Genetic susceptibility to malignant neoplasm of breast: Secondary | ICD-10-CM | POA: Diagnosis not present

## 2022-11-06 DIAGNOSIS — Z0000 Encounter for general adult medical examination without abnormal findings: Secondary | ICD-10-CM | POA: Diagnosis not present

## 2022-11-19 ENCOUNTER — Encounter (INDEPENDENT_AMBULATORY_CARE_PROVIDER_SITE_OTHER): Payer: Medicare Other | Admitting: Ophthalmology

## 2022-11-20 NOTE — Progress Notes (Signed)
[facility] - Clinic Note  11/28/2022   CHIEF COMPLAINT Patient presents for Retina Follow Up  HISTORY OF PRESENT ILLNESS: Debbie Abbott is a 67 y.o. female who presents to the clinic today for:  HPI     Retina Follow Up   In left eye.  This started 5 weeks ago.  Duration of 5 weeks.  Since onset it is stable.  I, the attending physician,  performed the HPI with the patient and updated documentation appropriately.        Comments   5 week retina tear OS pt is reporting no vision changes noticed she denies ay flashes or floaters       Last edited by Rennis Chris, MD on 11/29/2022 12:20 AM.    Patient states that she is not seeing new flashes or floaters.  Referring physician: Olivia Canter, MD [address] [place],  [place] [address]  HISTORICAL INFORMATION:  Selected notes from the MEDICAL RECORD NUMBER Referred by Dr. Fabian Sharp for retinal tear OS LEE:  Ocular Hx- PMH-   CURRENT MEDICATIONS: No current outpatient medications on file. (Ophthalmic Drugs)   No current facility-administered medications for this visit. (Ophthalmic Drugs)   Current Outpatient Medications (Other)  Medication Sig   atorvastatin (LIPITOR) 10 MG tablet Take 10 mg by mouth daily.   diclofenac (VOLTAREN) 75 MG EC tablet Take 1 tablet (75 mg total) by mouth 2 (two) times daily.   losartan-hydrochlorothiazide (HYZAAR) 50-12.5 MG tablet 11/03/2015 Losartan-Hydrochlorothiazide Oral 50 mg-12.5 mg  PO 1.0 TABLET(S) daily  11/03/2015  active   ondansetron (ZOFRAN-ODT) 4 MG disintegrating tablet Take 1 tablet (4 mg total) by mouth every 8 (eight) hours as needed for nausea or vomiting.   No current facility-administered medications for this visit. (Other)   REVIEW OF SYSTEMS: ROS   Positive for: Cardiovascular, Eyes Negative for: Constitutional, Gastrointestinal, Neurological, Skin, Genitourinary, Musculoskeletal, HENT, Endocrine, Respiratory, Psychiatric,  Allergic/Imm, Heme/Lymph Last edited by Etheleen Mayhew, COT on 11/28/2022  1:24 PM.     ALLERGIES No Known Allergies PAST MEDICAL HISTORY Past Medical History:  Diagnosis Date   Breast cancer (HCC)    Hypertension    Past Surgical History:  Procedure Laterality Date   BREAST LUMPECTOMY Right    BREAST LUMPECTOMY Left    skin cancer removal     FAMILY HISTORY Family History  Problem Relation Age of Onset   Breast cancer Sister 55   Breast cancer Paternal Aunt    SOCIAL HISTORY Social History   Tobacco Use   Smoking status: Former    Current packs/day: 0.00    Average packs/day: 1.5 packs/day for 14.0 years (21.0 ttl pk-yrs)    Types: Cigarettes    Start date: 03/04/1974    Quit date: 03/04/1988    Years since quitting: 34.7  Vaping Use   Vaping status: Never Used  Substance Use Topics   Alcohol use: Yes    Alcohol/week: 2.0 standard drinks of alcohol    Types: 1 Glasses of wine, 1 Shots of liquor per week    Comment: occasionally   Drug use: Never       OPHTHALMIC EXAM:  Base Eye Exam     Visual Acuity (Snellen - Linear)       Right Left   Dist cc 20/30 -2 20/40 -2   Dist ph cc NI 20/40 +1         Tonometry (Tonopen, 1:29 PM)  Right Left   Pressure 10 12         Pupils       Pupils Dark Light Shape React APD   Right PERRL 3 2 Round Brisk None   Left PERRL 3 2 Round Brisk None         Visual Fields       Left Right    Full Full         Extraocular Movement       Right Left    Full, Ortho Full, Ortho         Neuro/Psych     Oriented x3: Yes   Mood/Affect: Normal         Dilation     Left eye: 2.5% Phenylephrine @ 1:29 PM           Slit Lamp and Fundus Exam     Slit Lamp Exam       Right Left   Lids/Lashes Dermatochalasis - upper lid, mild MGD Dermatochalasis - upper lid, mild MGD   Conjunctiva/Sclera White and quiet White and quiet   Cornea 1-2+ inferior Punctate epithelial erosions arcus, trace  tear film debris, 2+ fine PEE   Anterior Chamber deep and clear deep and clear   Iris Round and not dilated Round and dilated   Lens 2-3+ Nuclear sclerosis, 2-3+ Cortical cataract 2-3+ Nuclear sclerosis, 2-3+ Cortical cataract   Anterior Vitreous mild syneresis mild syneresis, no pigment, Posterior vitreous detachment, vitreous condensations         Fundus Exam       Right Left   Disc Pink and Sharp Pink and Sharp, mild PPP   C/D Ratio 0.2 0.3   Macula Flat, Blunted foveal reflex, RPE mottling, No heme or edema Flat, Blunted foveal reflex, RPE mottling, No heme or edema   Vessels attenuated, Tortuous attenuated, Tortuous   Periphery Attached; no heme Peripheral HST at 1000 with +SRF extending from 0900-1000 -- mild pigmented demarcation line surrounding SRF -- good laser changes surrounding; focal CR atrophy at nasal edge of tear -- old operculated hole, no new RT/RD           IMAGING AND PROCEDURES  Imaging and Procedures for 11/28/2022  OCT, Retina - OU - Both Eyes       Right Eye Quality was good. Central Foveal Thickness: 286. Progression has been stable. Findings include normal foveal contour, no IRF, no SRF.   Left Eye Quality was good. Central Foveal Thickness: 292. Progression has been stable. Findings include normal foveal contour, no IRF, no SRF (Shallow SRF nasal periphery caught on widefield -- interval improvement in vitreous opacities).   Notes *Images captured and stored on drive  Diagnosis / Impression:  OD: NFP, no IRF/SRF OS: NFP, no IRF, +Shallow SRF nasal periphery caught on widefield -- stable; interval improvement in vitreous opacities  Clinical management:  See below  Abbreviations: NFP - Normal foveal profile. CME - cystoid macular edema. PED - pigment epithelial detachment. IRF - intraretinal fluid. SRF - subretinal fluid. EZ - ellipsoid zone. ERM - epiretinal membrane. ORA - outer retinal atrophy. ORT - outer retinal tubulation. SRHM - subretinal  hyper-reflective material. IRHM - intraretinal hyper-reflective material           ASSESSMENT/PLAN:   ICD-10-CM   1. Retinal tear of left eye  H33.312     2. Left retinal detachment  H33.22     3. Essential hypertension  I10     4. Hypertensive retinopathy  of both eyes  H35.033 OCT, Retina - OU - Both Eyes    5. Combined forms of age-related cataract of both eyes  H25.813      1,2. Retinal tear with focal, peripheral retinal detachment OS - asymptomatic horseshoe tear at 1000 with focal peripheral retinal detachment from 0900 to 1000 -- found on routine dilated exam - s/p laser retinopexy OS (08.14.24) -- good laser changes surrounding  - no new RT/RD - clear from a retina standpoint to proceed with cataract surgery when pt and surgeon are ready  - f/u 3 months, DFE, OCT -- scan through detachment (0900-1000)  3,4. Hypertensive retinopathy OU - discussed importance of tight BP control - monitor  5. Mixed Cataract OU - The symptoms of cataract, surgical options, and treatments and risks were discussed with patient. - discussed diagnosis and progression - under the expert management of Dr. Fabian Sharp - clear from a retina standpoint to proceed with cataract surgery when pt and surgeon are ready    Ophthalmic Meds Ordered this visit:  No orders of the defined types were placed in this encounter.    Return in about 3 months (around 02/27/2023) for f/u RD OS, DFE, OCT.  There are no Patient Instructions on file for this visit.  Explained the diagnoses, plan, and follow up with the patient and they expressed understanding.  Patient expressed understanding of the importance of proper follow up care.   This document serves as a record of services personally performed by Karie Chimera, MD, PhD. It was created on their behalf by Glee Arvin. Manson Passey, OA an ophthalmic technician. The creation of this record is the provider's dictation and/or activities during the visit.     Electronically signed by: Glee Arvin. Manson Passey, OA 11/29/22 12:21 AM  This document serves as a record of services personally performed by Karie Chimera, MD, PhD. It was created on their behalf by Charlette Caffey, COT an ophthalmic technician. The creation of this record is the provider's dictation and/or activities during the visit.    Electronically signed by:  Charlette Caffey, COT  11/29/22 12:21 AM  Karie Chimera, M.D., Ph.D. Diseases & Surgery of the Retina and Vitreous [facility] 11/28/2022  I have reviewed the above documentation for accuracy and completeness, and I agree with the above. Karie Chimera, M.D., Ph.D. 11/29/22 12:23 AM  Abbreviations: M myopia (nearsighted); A astigmatism; H hyperopia (farsighted); P presbyopia; Mrx spectacle prescription;  CTL contact lenses; OD right eye; OS left eye; OU both eyes  XT exotropia; ET esotropia; PEK punctate epithelial keratitis; PEE punctate epithelial erosions; DES dry eye syndrome; MGD meibomian gland dysfunction; ATs artificial tears; PFAT's preservative free artificial tears; NSC nuclear sclerotic cataract; PSC posterior subcapsular cataract; ERM epi-retinal membrane; PVD posterior vitreous detachment; RD retinal detachment; DM diabetes mellitus; DR diabetic retinopathy; NPDR non-proliferative diabetic retinopathy; PDR proliferative diabetic retinopathy; CSME clinically significant macular edema; DME diabetic macular edema; dbh dot blot hemorrhages; CWS cotton wool spot; POAG primary open angle glaucoma; C/D cup-to-disc ratio; HVF humphrey visual field; GVF goldmann visual field; OCT optical coherence tomography; IOP intraocular pressure; BRVO Branch retinal vein occlusion; CRVO central retinal vein occlusion; CRAO central retinal artery occlusion; BRAO branch retinal artery occlusion; RT retinal tear; SB scleral buckle; PPV pars plana vitrectomy; VH Vitreous hemorrhage; PRP panretinal laser photocoagulation;  IVK intravitreal kenalog; VMT vitreomacular traction; MH Macular hole;  NVD neovascularization of the disc; NVE neovascularization elsewhere; AREDS age related eye  disease study; ARMD age related macular degeneration; POAG primary open angle glaucoma; EBMD epithelial/anterior basement membrane dystrophy; ACIOL anterior chamber intraocular lens; IOL intraocular lens; PCIOL posterior chamber intraocular lens; Phaco/IOL phacoemulsification with intraocular lens placement; PRK photorefractive keratectomy; LASIK laser assisted in situ keratomileusis; HTN hypertension; DM diabetes mellitus; COPD chronic obstructive pulmonary disease

## 2022-11-28 ENCOUNTER — Encounter (INDEPENDENT_AMBULATORY_CARE_PROVIDER_SITE_OTHER): Payer: Self-pay | Admitting: Ophthalmology

## 2022-11-28 ENCOUNTER — Ambulatory Visit (INDEPENDENT_AMBULATORY_CARE_PROVIDER_SITE_OTHER): Payer: Medicare Other | Admitting: Ophthalmology

## 2022-11-28 DIAGNOSIS — I10 Essential (primary) hypertension: Secondary | ICD-10-CM

## 2022-11-28 DIAGNOSIS — H25813 Combined forms of age-related cataract, bilateral: Secondary | ICD-10-CM

## 2022-11-28 DIAGNOSIS — H35033 Hypertensive retinopathy, bilateral: Secondary | ICD-10-CM

## 2022-11-28 DIAGNOSIS — H33312 Horseshoe tear of retina without detachment, left eye: Secondary | ICD-10-CM | POA: Diagnosis not present

## 2022-11-28 DIAGNOSIS — H3322 Serous retinal detachment, left eye: Secondary | ICD-10-CM

## 2022-11-29 ENCOUNTER — Encounter (INDEPENDENT_AMBULATORY_CARE_PROVIDER_SITE_OTHER): Payer: Self-pay | Admitting: Ophthalmology

## 2022-12-03 ENCOUNTER — Other Ambulatory Visit: Payer: Self-pay | Admitting: Family Medicine

## 2022-12-03 ENCOUNTER — Ambulatory Visit
Admission: RE | Admit: 2022-12-03 | Discharge: 2022-12-03 | Disposition: A | Discharge status: Home / Self Care | Payer: Medicare Other | Source: Ambulatory Visit | Attending: Internal Medicine | Admitting: Internal Medicine

## 2022-12-03 DIAGNOSIS — Z1231 Encounter for screening mammogram for malignant neoplasm of breast: Secondary | ICD-10-CM

## 2022-12-04 DIAGNOSIS — Z79899 Other long term (current) drug therapy: Secondary | ICD-10-CM | POA: Diagnosis not present

## 2022-12-04 DIAGNOSIS — Z111 Encounter for screening for respiratory tuberculosis: Secondary | ICD-10-CM | POA: Diagnosis not present

## 2022-12-04 DIAGNOSIS — H33312 Horseshoe tear of retina without detachment, left eye: Secondary | ICD-10-CM | POA: Diagnosis not present

## 2022-12-04 DIAGNOSIS — H2513 Age-related nuclear cataract, bilateral: Secondary | ICD-10-CM | POA: Diagnosis not present

## 2022-12-04 DIAGNOSIS — M0609 Rheumatoid arthritis without rheumatoid factor, multiple sites: Secondary | ICD-10-CM | POA: Diagnosis not present

## 2022-12-04 DIAGNOSIS — R5383 Other fatigue: Secondary | ICD-10-CM | POA: Diagnosis not present

## 2022-12-23 DIAGNOSIS — Z79899 Other long term (current) drug therapy: Secondary | ICD-10-CM | POA: Diagnosis not present

## 2022-12-23 DIAGNOSIS — E669 Obesity, unspecified: Secondary | ICD-10-CM | POA: Diagnosis not present

## 2022-12-23 DIAGNOSIS — R202 Paresthesia of skin: Secondary | ICD-10-CM | POA: Diagnosis not present

## 2022-12-23 DIAGNOSIS — M0609 Rheumatoid arthritis without rheumatoid factor, multiple sites: Secondary | ICD-10-CM | POA: Diagnosis not present

## 2022-12-23 DIAGNOSIS — Z6830 Body mass index (BMI) 30.0-30.9, adult: Secondary | ICD-10-CM | POA: Diagnosis not present

## 2022-12-23 DIAGNOSIS — M1991 Primary osteoarthritis, unspecified site: Secondary | ICD-10-CM | POA: Diagnosis not present

## 2023-01-01 DIAGNOSIS — M0609 Rheumatoid arthritis without rheumatoid factor, multiple sites: Secondary | ICD-10-CM | POA: Diagnosis not present

## 2023-01-29 DIAGNOSIS — M0609 Rheumatoid arthritis without rheumatoid factor, multiple sites: Secondary | ICD-10-CM | POA: Diagnosis not present

## 2023-02-17 DIAGNOSIS — H2513 Age-related nuclear cataract, bilateral: Secondary | ICD-10-CM | POA: Diagnosis not present

## 2023-02-20 NOTE — Progress Notes (Signed)
 [facility] - Clinic Note  03/04/2023   CHIEF COMPLAINT Patient presents for Retina Follow Up  HISTORY OF PRESENT ILLNESS: Debbie Abbott is a 67 y.o. female who presents to the clinic today for:  HPI     Retina Follow Up   Patient presents with  Retinal Break/Detachment.  In left eye.  This started 3 months ago.  I, the attending physician,  performed the HPI with the patient and updated documentation appropriately.        Comments   Patient here for 3 months retina follow up for RD OS. Patient states vision doing ok. No eye pain. Scheduled for cataract surgery OS January 10 th.       Last edited by Briyanna Billingham, MD on 03/05/2023  1:52 AM.    Pt is scheduled for left eye cataract sx on January 10th   Referring physician: Octavia Charlie Hamilton, MD [address] [place],  [place] [address]  HISTORICAL INFORMATION:  Selected notes from the MEDICAL RECORD NUMBER Referred by Dr. Hamilton Octavia for retinal tear OS LEE:  Ocular Hx- PMH-   CURRENT MEDICATIONS: No current outpatient medications on file. (Ophthalmic Drugs)   No current facility-administered medications for this visit. (Ophthalmic Drugs)   Current Outpatient Medications (Other)  Medication Sig   atorvastatin (LIPITOR) 10 MG tablet Take 10 mg by mouth daily.   diclofenac  (VOLTAREN ) 75 MG EC tablet Take 1 tablet (75 mg total) by mouth 2 (two) times daily.   losartan-hydrochlorothiazide (HYZAAR) 50-12.5 MG tablet 11/03/2015 Losartan-Hydrochlorothiazide Oral 50 mg-12.5 mg  PO 1.0 TABLET(S) daily  11/03/2015  active   ondansetron  (ZOFRAN -ODT) 4 MG disintegrating tablet Take 1 tablet (4 mg total) by mouth every 8 (eight) hours as needed for nausea or vomiting.   No current facility-administered medications for this visit. (Other)   REVIEW OF SYSTEMS: ROS   Positive for: Cardiovascular, Eyes Negative for: Constitutional, Gastrointestinal, Neurological, Skin, Genitourinary, Musculoskeletal,  HENT, Endocrine, Respiratory, Psychiatric, Allergic/Imm, Heme/Lymph Last edited by Orval Asberry RAMAN, COA on 03/04/2023  9:33 AM.     ALLERGIES No Known Allergies  PAST MEDICAL HISTORY Past Medical History:  Diagnosis Date   Breast cancer (HCC)    Hypertension    Past Surgical History:  Procedure Laterality Date   BREAST LUMPECTOMY Right    BREAST LUMPECTOMY Left    REDUCTION MAMMAPLASTY     skin cancer removal     FAMILY HISTORY Family History  Problem Relation Age of Onset   Breast cancer Sister 104   Breast cancer Paternal Aunt    SOCIAL HISTORY Social History   Tobacco Use   Smoking status: Former    Current packs/day: 0.00    Average packs/day: 1.5 packs/day for 14.0 years (21.0 ttl pk-yrs)    Types: Cigarettes    Start date: 03/04/1974    Quit date: 03/04/1988    Years since quitting: 35.0  Vaping Use   Vaping status: Never Used  Substance Use Topics   Alcohol use: Yes    Alcohol/week: 2.0 standard drinks of alcohol    Types: 1 Glasses of wine, 1 Shots of liquor per week    Comment: occasionally   Drug use: Never       OPHTHALMIC EXAM:  Base Eye Exam     Visual Acuity (Snellen - Linear)       Right Left   Dist cc 20/30 -2 20/25   Dist ph cc 20/25 -2 NI  Correction: Glasses         Tonometry (Tonopen, 9:31 AM)       Right Left   Pressure 12 11         Pupils       Dark Light Shape React APD   Right 3 2 Round Brisk None   Left 3 2 Round Brisk None         Visual Fields (Counting fingers)       Left Right    Full Full         Extraocular Movement       Right Left    Full, Ortho Full, Ortho         Neuro/Psych     Oriented x3: Yes   Mood/Affect: Normal         Dilation     Both eyes: 1.0% Mydriacyl, 2.5% Phenylephrine @ 9:30 AM           Slit Lamp and Fundus Exam     Slit Lamp Exam       Right Left   Lids/Lashes Dermatochalasis - upper lid, mild MGD Dermatochalasis - upper lid, mild MGD    Conjunctiva/Sclera White and quiet White and quiet   Cornea 1-2+ inferior Punctate epithelial erosions arcus, trace tear film debris, 2+ fine PEE   Anterior Chamber deep and clear deep and clear   Iris Round and not dilated Round and dilated   Lens 2-3+ Nuclear sclerosis, 2-3+ Cortical cataract 2-3+ Nuclear sclerosis, 2-3+ Cortical cataract   Anterior Vitreous mild syneresis, mild scattered vitreous condensations mild syneresis, no pigment, Posterior vitreous detachment, vitreous condensations         Fundus Exam       Right Left   Disc Pink and Sharp Pink and Sharp, mild PPP   C/D Ratio 0.2 0.3   Macula Flat, Blunted foveal reflex, RPE mottling, No heme or edema Flat, Blunted foveal reflex, RPE mottling, No heme or edema   Vessels mild attenuation, mild tortuosity mild attenuation, mild tortuosity   Periphery Attached; no heme Peripheral HST at 1000 with +SRF extending from 0900-1000 -- mild pigmented demarcation line surrounding SRF -- good laser changes surrounding; focal CR atrophy at nasal edge of tear -- old operculated hole, no new RT/RD           IMAGING AND PROCEDURES  Imaging and Procedures for 03/04/2023  OCT, Retina - OU - Both Eyes       Right Eye Quality was good. Central Foveal Thickness: 286. Progression has been stable. Findings include normal foveal contour, no IRF, no SRF.   Left Eye Quality was good. Central Foveal Thickness: 290. Progression has been stable. Findings include normal foveal contour, no IRF, no SRF (Shallow SRF nasal periphery caught on widefield; mild vitreous opacities).   Notes *Images captured and stored on drive  Diagnosis / Impression:  OD: NFP, no IRF/SRF OS: NFP, no IRF, +Shallow SRF nasal periphery caught on widefield -- stable; mild vitreous opacities  Clinical management:  See below  Abbreviations: NFP - Normal foveal profile. CME - cystoid macular edema. PED - pigment epithelial detachment. IRF - intraretinal fluid. SRF -  subretinal fluid. EZ - ellipsoid zone. ERM - epiretinal membrane. ORA - outer retinal atrophy. ORT - outer retinal tubulation. SRHM - subretinal hyper-reflective material. IRHM - intraretinal hyper-reflective material           ASSESSMENT/PLAN:   ICD-10-CM   1. Retinal tear of left eye  H33.312 OCT, Retina -  OU - Both Eyes    2. Left retinal detachment  H33.22     3. Essential hypertension  I10     4. Hypertensive retinopathy of both eyes  H35.033     5. Combined forms of age-related cataract of both eyes  H25.813      1,2. Retinal tear with focal, peripheral retinal detachment OS - asymptomatic horseshoe tear at 1000 with focal peripheral retinal detachment from 0900 to 1000 -- found on routine dilated exam - s/p laser retinopexy OS (08.14.24) -- good laser changes surrounding  - no new RT/RD - clear from a retina standpoint to proceed with cataract surgery when pt and surgeon are ready  - pt is cleared from a retina standpoint for release to Dr. Octavia and resumption of primary eye care  3,4. Hypertensive retinopathy OU - discussed importance of tight BP control - monitor  5. Mixed Cataract OU - The symptoms of cataract, surgical options, and treatments and risks were discussed with patient. - discussed diagnosis and progression - under the expert management of Dr. Glendia Octavia - clear from a retina standpoint to proceed with cataract surgery when pt and surgeon are ready  - CEIOL OS scheduled for Mar 14 2023  Ophthalmic Meds Ordered this visit:  No orders of the defined types were placed in this encounter.    Return if symptoms worsen or fail to improve.  There are no Patient Instructions on file for this visit.  Explained the diagnoses, plan, and follow up with the patient and they expressed understanding.  Patient expressed understanding of the importance of proper follow up care.    This document serves as a record of services personally performed by Redell JUDITHANN Hans, MD, PhD. It was created on their behalf by Wanda GEANNIE Keens, COT an ophthalmic technician. The creation of this record is the provider's dictation and/or activities during the visit.    Electronically signed by:  Wanda GEANNIE Keens, COT  03/05/23 1:52 AM  This document serves as a record of services personally performed by Redell JUDITHANN Hans, MD, PhD. It was created on their behalf by Alan PARAS. Delores, OA an ophthalmic technician. The creation of this record is the provider's dictation and/or activities during the visit.    Electronically signed by: Alan PARAS. Delores, OA 03/05/23 1:52 AM  Redell JUDITHANN Hans, M.D., Ph.D. Diseases & Surgery of the Retina and Vitreous [facility] 03/04/2023  I have reviewed the above documentation for accuracy and completeness, and I agree with the above. Redell JUDITHANN Hans, M.D., Ph.D. 03/05/23 1:54 AM   Abbreviations: M myopia (nearsighted); A astigmatism; H hyperopia (farsighted); P presbyopia; Mrx spectacle prescription;  CTL contact lenses; OD right eye; OS left eye; OU both eyes  XT exotropia; ET esotropia; PEK punctate epithelial keratitis; PEE punctate epithelial erosions; DES dry eye syndrome; MGD meibomian gland dysfunction; ATs artificial tears; PFAT's preservative free artificial tears; NSC nuclear sclerotic cataract; PSC posterior subcapsular cataract; ERM epi-retinal membrane; PVD posterior vitreous detachment; RD retinal detachment; DM diabetes mellitus; DR diabetic retinopathy; NPDR non-proliferative diabetic retinopathy; PDR proliferative diabetic retinopathy; CSME clinically significant macular edema; DME diabetic macular edema; dbh dot blot hemorrhages; CWS cotton wool spot; POAG primary open angle glaucoma; C/D cup-to-disc ratio; HVF humphrey visual field; GVF goldmann visual field; OCT optical coherence tomography; IOP intraocular pressure; BRVO Branch retinal vein occlusion; CRVO central retinal vein occlusion; CRAO central  retinal artery occlusion; BRAO branch retinal artery occlusion; RT retinal tear; SB  scleral buckle; PPV pars plana vitrectomy; VH Vitreous hemorrhage; PRP panretinal laser photocoagulation; IVK intravitreal kenalog ; VMT vitreomacular traction; MH Macular hole;  NVD neovascularization of the disc; NVE neovascularization elsewhere; AREDS age related eye disease study; ARMD age related macular degeneration; POAG primary open angle glaucoma; EBMD epithelial/anterior basement membrane dystrophy; ACIOL anterior chamber intraocular lens; IOL intraocular lens; PCIOL posterior chamber intraocular lens; Phaco/IOL phacoemulsification with intraocular lens placement; PRK photorefractive keratectomy; LASIK laser assisted in situ keratomileusis; HTN hypertension; DM diabetes mellitus; COPD chronic obstructive pulmonary disease

## 2023-03-03 DIAGNOSIS — M0609 Rheumatoid arthritis without rheumatoid factor, multiple sites: Secondary | ICD-10-CM | POA: Diagnosis not present

## 2023-03-04 ENCOUNTER — Encounter (INDEPENDENT_AMBULATORY_CARE_PROVIDER_SITE_OTHER): Payer: Self-pay | Admitting: Ophthalmology

## 2023-03-04 ENCOUNTER — Ambulatory Visit (INDEPENDENT_AMBULATORY_CARE_PROVIDER_SITE_OTHER): Payer: Medicare Other | Admitting: Ophthalmology

## 2023-03-04 DIAGNOSIS — H35033 Hypertensive retinopathy, bilateral: Secondary | ICD-10-CM | POA: Diagnosis not present

## 2023-03-04 DIAGNOSIS — H33312 Horseshoe tear of retina without detachment, left eye: Secondary | ICD-10-CM

## 2023-03-04 DIAGNOSIS — H3322 Serous retinal detachment, left eye: Secondary | ICD-10-CM

## 2023-03-04 DIAGNOSIS — H25813 Combined forms of age-related cataract, bilateral: Secondary | ICD-10-CM | POA: Diagnosis not present

## 2023-03-04 DIAGNOSIS — I10 Essential (primary) hypertension: Secondary | ICD-10-CM | POA: Diagnosis not present

## 2023-03-05 ENCOUNTER — Encounter (INDEPENDENT_AMBULATORY_CARE_PROVIDER_SITE_OTHER): Payer: Self-pay | Admitting: Ophthalmology

## 2023-03-14 DIAGNOSIS — H25812 Combined forms of age-related cataract, left eye: Secondary | ICD-10-CM | POA: Diagnosis not present

## 2023-03-14 DIAGNOSIS — H52222 Regular astigmatism, left eye: Secondary | ICD-10-CM | POA: Diagnosis not present

## 2023-03-26 DIAGNOSIS — H2511 Age-related nuclear cataract, right eye: Secondary | ICD-10-CM | POA: Diagnosis not present

## 2023-03-28 DIAGNOSIS — H25811 Combined forms of age-related cataract, right eye: Secondary | ICD-10-CM | POA: Diagnosis not present

## 2023-03-28 DIAGNOSIS — H53451 Other localized visual field defect, right eye: Secondary | ICD-10-CM | POA: Diagnosis not present

## 2023-04-02 DIAGNOSIS — M0609 Rheumatoid arthritis without rheumatoid factor, multiple sites: Secondary | ICD-10-CM | POA: Diagnosis not present

## 2023-04-30 DIAGNOSIS — M0609 Rheumatoid arthritis without rheumatoid factor, multiple sites: Secondary | ICD-10-CM | POA: Diagnosis not present

## 2023-05-06 DIAGNOSIS — N1831 Chronic kidney disease, stage 3a: Secondary | ICD-10-CM | POA: Diagnosis not present

## 2023-05-06 DIAGNOSIS — E782 Mixed hyperlipidemia: Secondary | ICD-10-CM | POA: Diagnosis not present

## 2023-05-06 DIAGNOSIS — I10 Essential (primary) hypertension: Secondary | ICD-10-CM | POA: Diagnosis not present

## 2023-05-28 DIAGNOSIS — M0609 Rheumatoid arthritis without rheumatoid factor, multiple sites: Secondary | ICD-10-CM | POA: Diagnosis not present

## 2023-06-23 DIAGNOSIS — M0609 Rheumatoid arthritis without rheumatoid factor, multiple sites: Secondary | ICD-10-CM | POA: Diagnosis not present

## 2023-06-23 DIAGNOSIS — Z6831 Body mass index (BMI) 31.0-31.9, adult: Secondary | ICD-10-CM | POA: Diagnosis not present

## 2023-06-23 DIAGNOSIS — Z79899 Other long term (current) drug therapy: Secondary | ICD-10-CM | POA: Diagnosis not present

## 2023-06-23 DIAGNOSIS — G5603 Carpal tunnel syndrome, bilateral upper limbs: Secondary | ICD-10-CM | POA: Diagnosis not present

## 2023-06-23 DIAGNOSIS — M1991 Primary osteoarthritis, unspecified site: Secondary | ICD-10-CM | POA: Diagnosis not present

## 2023-06-23 DIAGNOSIS — E669 Obesity, unspecified: Secondary | ICD-10-CM | POA: Diagnosis not present

## 2023-06-25 DIAGNOSIS — M0609 Rheumatoid arthritis without rheumatoid factor, multiple sites: Secondary | ICD-10-CM | POA: Diagnosis not present

## 2023-07-03 DIAGNOSIS — M25561 Pain in right knee: Secondary | ICD-10-CM | POA: Diagnosis not present

## 2023-07-03 DIAGNOSIS — M25531 Pain in right wrist: Secondary | ICD-10-CM | POA: Diagnosis not present

## 2023-07-03 DIAGNOSIS — M25532 Pain in left wrist: Secondary | ICD-10-CM | POA: Diagnosis not present

## 2023-07-03 DIAGNOSIS — G8929 Other chronic pain: Secondary | ICD-10-CM | POA: Diagnosis not present

## 2023-07-23 DIAGNOSIS — M0609 Rheumatoid arthritis without rheumatoid factor, multiple sites: Secondary | ICD-10-CM | POA: Diagnosis not present

## 2023-08-06 DIAGNOSIS — M25561 Pain in right knee: Secondary | ICD-10-CM | POA: Diagnosis not present

## 2023-08-06 DIAGNOSIS — M069 Rheumatoid arthritis, unspecified: Secondary | ICD-10-CM | POA: Diagnosis not present

## 2023-08-14 DIAGNOSIS — G5603 Carpal tunnel syndrome, bilateral upper limbs: Secondary | ICD-10-CM | POA: Diagnosis not present

## 2023-08-20 DIAGNOSIS — M0609 Rheumatoid arthritis without rheumatoid factor, multiple sites: Secondary | ICD-10-CM | POA: Diagnosis not present

## 2023-08-21 DIAGNOSIS — M79642 Pain in left hand: Secondary | ICD-10-CM | POA: Diagnosis not present

## 2023-08-21 DIAGNOSIS — M79641 Pain in right hand: Secondary | ICD-10-CM | POA: Diagnosis not present

## 2023-09-15 DIAGNOSIS — E7800 Pure hypercholesterolemia, unspecified: Secondary | ICD-10-CM | POA: Diagnosis not present

## 2023-09-15 DIAGNOSIS — H35033 Hypertensive retinopathy, bilateral: Secondary | ICD-10-CM | POA: Diagnosis not present

## 2023-09-15 DIAGNOSIS — Z1231 Encounter for screening mammogram for malignant neoplasm of breast: Secondary | ICD-10-CM | POA: Diagnosis not present

## 2023-09-15 DIAGNOSIS — N1831 Chronic kidney disease, stage 3a: Secondary | ICD-10-CM | POA: Diagnosis not present

## 2023-09-15 DIAGNOSIS — E559 Vitamin D deficiency, unspecified: Secondary | ICD-10-CM | POA: Diagnosis not present

## 2023-09-15 DIAGNOSIS — Z131 Encounter for screening for diabetes mellitus: Secondary | ICD-10-CM | POA: Diagnosis not present

## 2023-09-15 DIAGNOSIS — Z1331 Encounter for screening for depression: Secondary | ICD-10-CM | POA: Diagnosis not present

## 2023-09-15 DIAGNOSIS — Z0000 Encounter for general adult medical examination without abnormal findings: Secondary | ICD-10-CM | POA: Diagnosis not present

## 2023-09-15 DIAGNOSIS — Z124 Encounter for screening for malignant neoplasm of cervix: Secondary | ICD-10-CM | POA: Diagnosis not present

## 2023-09-15 DIAGNOSIS — Z6830 Body mass index (BMI) 30.0-30.9, adult: Secondary | ICD-10-CM | POA: Diagnosis not present

## 2023-09-15 DIAGNOSIS — I10 Essential (primary) hypertension: Secondary | ICD-10-CM | POA: Diagnosis not present

## 2023-09-15 DIAGNOSIS — Z23 Encounter for immunization: Secondary | ICD-10-CM | POA: Diagnosis not present

## 2023-09-17 DIAGNOSIS — M0609 Rheumatoid arthritis without rheumatoid factor, multiple sites: Secondary | ICD-10-CM | POA: Diagnosis not present

## 2023-10-15 DIAGNOSIS — M0609 Rheumatoid arthritis without rheumatoid factor, multiple sites: Secondary | ICD-10-CM | POA: Diagnosis not present

## 2023-10-15 DIAGNOSIS — I129 Hypertensive chronic kidney disease with stage 1 through stage 4 chronic kidney disease, or unspecified chronic kidney disease: Secondary | ICD-10-CM | POA: Diagnosis not present

## 2023-10-15 DIAGNOSIS — M069 Rheumatoid arthritis, unspecified: Secondary | ICD-10-CM | POA: Diagnosis not present

## 2023-10-15 DIAGNOSIS — R7989 Other specified abnormal findings of blood chemistry: Secondary | ICD-10-CM | POA: Diagnosis not present

## 2023-10-15 DIAGNOSIS — N1831 Chronic kidney disease, stage 3a: Secondary | ICD-10-CM | POA: Diagnosis not present

## 2023-10-15 DIAGNOSIS — E7800 Pure hypercholesterolemia, unspecified: Secondary | ICD-10-CM | POA: Diagnosis not present

## 2023-10-15 DIAGNOSIS — N1832 Chronic kidney disease, stage 3b: Secondary | ICD-10-CM | POA: Diagnosis not present

## 2023-11-12 DIAGNOSIS — M0609 Rheumatoid arthritis without rheumatoid factor, multiple sites: Secondary | ICD-10-CM | POA: Diagnosis not present

## 2023-12-17 DIAGNOSIS — R5383 Other fatigue: Secondary | ICD-10-CM | POA: Diagnosis not present

## 2023-12-17 DIAGNOSIS — M0609 Rheumatoid arthritis without rheumatoid factor, multiple sites: Secondary | ICD-10-CM | POA: Diagnosis not present

## 2023-12-17 DIAGNOSIS — Z79899 Other long term (current) drug therapy: Secondary | ICD-10-CM | POA: Diagnosis not present

## 2023-12-17 DIAGNOSIS — Z111 Encounter for screening for respiratory tuberculosis: Secondary | ICD-10-CM | POA: Diagnosis not present

## 2023-12-23 DIAGNOSIS — E663 Overweight: Secondary | ICD-10-CM | POA: Diagnosis not present

## 2023-12-23 DIAGNOSIS — Z79899 Other long term (current) drug therapy: Secondary | ICD-10-CM | POA: Diagnosis not present

## 2023-12-23 DIAGNOSIS — G5603 Carpal tunnel syndrome, bilateral upper limbs: Secondary | ICD-10-CM | POA: Diagnosis not present

## 2023-12-23 DIAGNOSIS — Z6829 Body mass index (BMI) 29.0-29.9, adult: Secondary | ICD-10-CM | POA: Diagnosis not present

## 2023-12-23 DIAGNOSIS — M0609 Rheumatoid arthritis without rheumatoid factor, multiple sites: Secondary | ICD-10-CM | POA: Diagnosis not present

## 2023-12-23 DIAGNOSIS — M1991 Primary osteoarthritis, unspecified site: Secondary | ICD-10-CM | POA: Diagnosis not present

## 2023-12-24 DIAGNOSIS — H33312 Horseshoe tear of retina without detachment, left eye: Secondary | ICD-10-CM | POA: Diagnosis not present

## 2023-12-24 DIAGNOSIS — Z79899 Other long term (current) drug therapy: Secondary | ICD-10-CM | POA: Diagnosis not present

## 2023-12-24 DIAGNOSIS — Z961 Presence of intraocular lens: Secondary | ICD-10-CM | POA: Diagnosis not present

## 2023-12-26 ENCOUNTER — Other Ambulatory Visit: Payer: Self-pay | Admitting: Family Medicine

## 2023-12-26 DIAGNOSIS — Z1231 Encounter for screening mammogram for malignant neoplasm of breast: Secondary | ICD-10-CM

## 2023-12-29 ENCOUNTER — Ambulatory Visit
Admission: RE | Admit: 2023-12-29 | Discharge: 2023-12-29 | Disposition: A | Source: Ambulatory Visit | Attending: Internal Medicine | Admitting: Internal Medicine

## 2023-12-29 DIAGNOSIS — Z1231 Encounter for screening mammogram for malignant neoplasm of breast: Secondary | ICD-10-CM | POA: Diagnosis not present

## 2024-01-08 DIAGNOSIS — N1831 Chronic kidney disease, stage 3a: Secondary | ICD-10-CM | POA: Diagnosis not present

## 2024-01-08 DIAGNOSIS — I129 Hypertensive chronic kidney disease with stage 1 through stage 4 chronic kidney disease, or unspecified chronic kidney disease: Secondary | ICD-10-CM | POA: Diagnosis not present

## 2024-01-08 DIAGNOSIS — M069 Rheumatoid arthritis, unspecified: Secondary | ICD-10-CM | POA: Diagnosis not present

## 2024-01-14 DIAGNOSIS — M0609 Rheumatoid arthritis without rheumatoid factor, multiple sites: Secondary | ICD-10-CM | POA: Diagnosis not present

## 2024-02-11 DIAGNOSIS — M0609 Rheumatoid arthritis without rheumatoid factor, multiple sites: Secondary | ICD-10-CM | POA: Diagnosis not present
